# Patient Record
Sex: Female | Born: 1970
Health system: Southern US, Community
[De-identification: ages and names within clinical notes are randomized; demographics above are authoritative.]

## PROBLEM LIST (undated history)

## (undated) DIAGNOSIS — E559 Vitamin D deficiency, unspecified: Secondary | ICD-10-CM

## (undated) DIAGNOSIS — R55 Syncope and collapse: Secondary | ICD-10-CM

## (undated) DIAGNOSIS — R002 Palpitations: Secondary | ICD-10-CM

## (undated) DIAGNOSIS — K219 Gastro-esophageal reflux disease without esophagitis: Secondary | ICD-10-CM

## (undated) DIAGNOSIS — D573 Sickle-cell trait: Secondary | ICD-10-CM

## (undated) DIAGNOSIS — F419 Anxiety disorder, unspecified: Secondary | ICD-10-CM

## (undated) DIAGNOSIS — D649 Anemia, unspecified: Secondary | ICD-10-CM

## (undated) HISTORY — DX: Palpitations: R00.2

## (undated) HISTORY — DX: Gastro-esophageal reflux disease without esophagitis: K21.9

## (undated) HISTORY — DX: Syncope and collapse: R55

## (undated) HISTORY — PX: WISDOM TOOTH EXTRACTION: SHX21

---

## 2007-03-27 ENCOUNTER — Emergency Department (HOSPITAL_COMMUNITY): Admission: EM | Admit: 2007-03-27 | Discharge: 2007-03-27 | Payer: Self-pay | Admitting: Emergency Medicine

## 2007-04-03 ENCOUNTER — Emergency Department (HOSPITAL_COMMUNITY): Admission: EM | Admit: 2007-04-03 | Discharge: 2007-04-03 | Payer: Self-pay | Admitting: Emergency Medicine

## 2007-11-04 ENCOUNTER — Emergency Department (HOSPITAL_COMMUNITY): Admission: EM | Admit: 2007-11-04 | Discharge: 2007-11-04 | Payer: Self-pay | Admitting: Emergency Medicine

## 2008-03-07 ENCOUNTER — Emergency Department (HOSPITAL_COMMUNITY): Admission: EM | Admit: 2008-03-07 | Discharge: 2008-03-07 | Payer: Self-pay | Admitting: Emergency Medicine

## 2008-06-28 ENCOUNTER — Emergency Department (HOSPITAL_COMMUNITY): Admission: EM | Admit: 2008-06-28 | Discharge: 2008-06-28 | Payer: Self-pay | Admitting: Family Medicine

## 2008-11-12 ENCOUNTER — Emergency Department (HOSPITAL_COMMUNITY): Admission: EM | Admit: 2008-11-12 | Discharge: 2008-11-12 | Payer: Self-pay | Admitting: Family Medicine

## 2008-11-25 ENCOUNTER — Emergency Department (HOSPITAL_COMMUNITY): Admission: EM | Admit: 2008-11-25 | Discharge: 2008-11-25 | Payer: Self-pay | Admitting: Emergency Medicine

## 2009-01-27 ENCOUNTER — Emergency Department (HOSPITAL_COMMUNITY): Admission: EM | Admit: 2009-01-27 | Discharge: 2009-01-27 | Payer: Self-pay | Admitting: Emergency Medicine

## 2009-06-11 ENCOUNTER — Emergency Department (HOSPITAL_COMMUNITY): Admission: EM | Admit: 2009-06-11 | Discharge: 2009-06-11 | Payer: Self-pay | Admitting: Emergency Medicine

## 2009-10-14 ENCOUNTER — Emergency Department (HOSPITAL_COMMUNITY): Admission: EM | Admit: 2009-10-14 | Discharge: 2009-10-14 | Payer: Self-pay | Admitting: Emergency Medicine

## 2010-02-13 ENCOUNTER — Encounter: Admission: RE | Admit: 2010-02-13 | Discharge: 2010-02-13 | Payer: Self-pay | Admitting: Family Medicine

## 2010-05-04 ENCOUNTER — Emergency Department (HOSPITAL_COMMUNITY): Admission: EM | Admit: 2010-05-04 | Discharge: 2010-05-04 | Payer: Self-pay | Admitting: Emergency Medicine

## 2010-05-23 DIAGNOSIS — J45909 Unspecified asthma, uncomplicated: Secondary | ICD-10-CM | POA: Insufficient documentation

## 2010-05-23 DIAGNOSIS — J309 Allergic rhinitis, unspecified: Secondary | ICD-10-CM | POA: Insufficient documentation

## 2010-08-18 DIAGNOSIS — E559 Vitamin D deficiency, unspecified: Secondary | ICD-10-CM | POA: Insufficient documentation

## 2010-10-20 DIAGNOSIS — D573 Sickle-cell trait: Secondary | ICD-10-CM | POA: Insufficient documentation

## 2010-11-21 ENCOUNTER — Emergency Department (HOSPITAL_COMMUNITY)
Admission: EM | Admit: 2010-11-21 | Discharge: 2010-11-21 | Disposition: A | Discharge status: Home / Self Care | Payer: Medicaid Other | Attending: Emergency Medicine | Admitting: Emergency Medicine

## 2010-11-21 DIAGNOSIS — R51 Headache: Secondary | ICD-10-CM | POA: Insufficient documentation

## 2010-11-21 DIAGNOSIS — J029 Acute pharyngitis, unspecified: Secondary | ICD-10-CM | POA: Insufficient documentation

## 2010-11-21 DIAGNOSIS — R05 Cough: Secondary | ICD-10-CM | POA: Insufficient documentation

## 2010-11-21 DIAGNOSIS — J45909 Unspecified asthma, uncomplicated: Secondary | ICD-10-CM | POA: Insufficient documentation

## 2010-11-21 DIAGNOSIS — R6889 Other general symptoms and signs: Secondary | ICD-10-CM | POA: Insufficient documentation

## 2010-11-21 DIAGNOSIS — D573 Sickle-cell trait: Secondary | ICD-10-CM | POA: Insufficient documentation

## 2010-11-21 DIAGNOSIS — J329 Chronic sinusitis, unspecified: Secondary | ICD-10-CM | POA: Insufficient documentation

## 2010-11-21 DIAGNOSIS — R059 Cough, unspecified: Secondary | ICD-10-CM | POA: Insufficient documentation

## 2010-11-21 DIAGNOSIS — J3489 Other specified disorders of nose and nasal sinuses: Secondary | ICD-10-CM | POA: Insufficient documentation

## 2010-12-15 LAB — URINALYSIS, ROUTINE W REFLEX MICROSCOPIC
Bilirubin Urine: NEGATIVE
Glucose, UA: NEGATIVE mg/dL
Hgb urine dipstick: NEGATIVE
Ketones, ur: NEGATIVE mg/dL
Protein, ur: NEGATIVE mg/dL
Urobilinogen, UA: 0.2 mg/dL (ref 0.0–1.0)
pH: 6.5 (ref 5.0–8.0)

## 2010-12-15 LAB — URINE CULTURE
Colony Count: NO GROWTH
Culture  Setup Time: 201108041802
Culture: NO GROWTH

## 2010-12-15 LAB — WET PREP, GENITAL: Clue Cells Wet Prep HPF POC: NONE SEEN

## 2010-12-15 LAB — GC/CHLAMYDIA PROBE AMP, GENITAL
Chlamydia, DNA Probe: NEGATIVE
GC Probe Amp, Genital: NEGATIVE

## 2011-01-05 LAB — CBC
HCT: 39.2 % (ref 36.0–46.0)
MCHC: 33.5 g/dL (ref 30.0–36.0)
MCV: 91 fL (ref 78.0–100.0)
Platelets: 278 10*3/uL (ref 150–400)
RBC: 4.3 MIL/uL (ref 3.87–5.11)
RDW: 13.1 % (ref 11.5–15.5)

## 2011-01-05 LAB — POCT I-STAT, CHEM 8
Calcium, Ion: 1.1 mmol/L — ABNORMAL LOW (ref 1.12–1.32)
Glucose, Bld: 90 mg/dL (ref 70–99)
HCT: 41 % (ref 36.0–46.0)
Hemoglobin: 13.9 g/dL (ref 12.0–15.0)
TCO2: 23 mmol/L (ref 0–100)

## 2011-01-05 LAB — DIFFERENTIAL
Basophils Absolute: 0 10*3/uL (ref 0.0–0.1)
Eosinophils Absolute: 0.1 10*3/uL (ref 0.0–0.7)
Eosinophils Relative: 1 % (ref 0–5)
Lymphocytes Relative: 28 % (ref 12–46)
Lymphs Abs: 2.6 10*3/uL (ref 0.7–4.0)
Monocytes Absolute: 0.7 10*3/uL (ref 0.1–1.0)
Neutrophils Relative %: 63 % (ref 43–77)

## 2011-01-05 LAB — URINALYSIS, ROUTINE W REFLEX MICROSCOPIC
Nitrite: NEGATIVE
Protein, ur: NEGATIVE mg/dL
Specific Gravity, Urine: 1.015 (ref 1.005–1.030)
pH: 7 (ref 5.0–8.0)

## 2011-01-05 LAB — WET PREP, GENITAL: Yeast Wet Prep HPF POC: NONE SEEN

## 2011-01-05 LAB — URINE MICROSCOPIC-ADD ON

## 2011-01-05 LAB — PREGNANCY, URINE: Preg Test, Ur: NEGATIVE

## 2011-01-10 LAB — URINALYSIS, ROUTINE W REFLEX MICROSCOPIC
Bilirubin Urine: NEGATIVE
Glucose, UA: NEGATIVE mg/dL
Ketones, ur: NEGATIVE mg/dL
Specific Gravity, Urine: 1.014 (ref 1.005–1.030)

## 2011-01-10 LAB — WET PREP, GENITAL
Trich, Wet Prep: NONE SEEN
Yeast Wet Prep HPF POC: NONE SEEN

## 2011-01-10 LAB — POCT PREGNANCY, URINE: Preg Test, Ur: NEGATIVE

## 2011-01-10 LAB — URINE MICROSCOPIC-ADD ON

## 2011-01-16 LAB — URINE CULTURE

## 2011-01-16 LAB — POCT I-STAT, CHEM 8
Chloride: 108 mEq/L (ref 96–112)
Creatinine, Ser: 0.9 mg/dL (ref 0.4–1.2)
Glucose, Bld: 90 mg/dL (ref 70–99)
HCT: 43 % (ref 36.0–46.0)
Hemoglobin: 14.6 g/dL (ref 12.0–15.0)
Potassium: 4.1 mEq/L (ref 3.5–5.1)
Sodium: 141 mEq/L (ref 135–145)

## 2011-01-16 LAB — URINE MICROSCOPIC-ADD ON

## 2011-01-16 LAB — URINALYSIS, ROUTINE W REFLEX MICROSCOPIC
Bilirubin Urine: NEGATIVE
Ketones, ur: NEGATIVE mg/dL
Nitrite: NEGATIVE
Specific Gravity, Urine: 1.011 (ref 1.005–1.030)
Urobilinogen, UA: 0.2 mg/dL (ref 0.0–1.0)
pH: 8 (ref 5.0–8.0)

## 2011-01-16 LAB — WET PREP, GENITAL
Trich, Wet Prep: NONE SEEN
WBC, Wet Prep HPF POC: NONE SEEN

## 2011-01-25 DIAGNOSIS — R5381 Other malaise: Secondary | ICD-10-CM | POA: Insufficient documentation

## 2011-01-25 DIAGNOSIS — N943 Premenstrual tension syndrome: Secondary | ICD-10-CM | POA: Insufficient documentation

## 2011-04-24 DIAGNOSIS — F411 Generalized anxiety disorder: Secondary | ICD-10-CM | POA: Insufficient documentation

## 2011-05-30 ENCOUNTER — Inpatient Hospital Stay (INDEPENDENT_AMBULATORY_CARE_PROVIDER_SITE_OTHER)
Admission: RE | Admit: 2011-05-30 | Discharge: 2011-05-30 | Disposition: A | Discharge status: Home / Self Care | Payer: Medicaid Other | Source: Ambulatory Visit | Attending: Family Medicine | Admitting: Family Medicine

## 2011-05-30 DIAGNOSIS — M79609 Pain in unspecified limb: Secondary | ICD-10-CM

## 2011-06-18 DIAGNOSIS — M25462 Effusion, left knee: Secondary | ICD-10-CM | POA: Insufficient documentation

## 2011-06-18 DIAGNOSIS — M7650 Patellar tendinitis, unspecified knee: Secondary | ICD-10-CM | POA: Insufficient documentation

## 2011-06-22 LAB — POCT RAPID STREP A: Streptococcus, Group A Screen (Direct): NEGATIVE

## 2011-06-22 LAB — INFLUENZA A AND B ANTIGEN (CONVERTED LAB): Influenza B Ag: NEGATIVE

## 2011-07-02 LAB — POCT RAPID STREP A: Streptococcus, Group A Screen (Direct): NEGATIVE

## 2011-07-17 LAB — URINALYSIS, ROUTINE W REFLEX MICROSCOPIC
Bilirubin Urine: NEGATIVE
Glucose, UA: NEGATIVE
Hgb urine dipstick: NEGATIVE
Ketones, ur: NEGATIVE
Nitrite: NEGATIVE
Protein, ur: NEGATIVE
Specific Gravity, Urine: 1.014
Urobilinogen, UA: 0.2
pH: 7.5

## 2011-07-17 LAB — POCT PREGNANCY, URINE
Operator id: 234111
Preg Test, Ur: NEGATIVE

## 2011-07-17 LAB — WET PREP, GENITAL
Clue Cells Wet Prep HPF POC: NONE SEEN
WBC, Wet Prep HPF POC: NONE SEEN

## 2011-07-17 LAB — RPR: RPR Ser Ql: NONREACTIVE

## 2012-01-11 ENCOUNTER — Ambulatory Visit: Payer: Medicaid Other | Attending: Family Medicine

## 2012-01-11 DIAGNOSIS — R5381 Other malaise: Secondary | ICD-10-CM | POA: Insufficient documentation

## 2012-01-11 DIAGNOSIS — M6281 Muscle weakness (generalized): Secondary | ICD-10-CM | POA: Insufficient documentation

## 2012-01-11 DIAGNOSIS — M25569 Pain in unspecified knee: Secondary | ICD-10-CM | POA: Insufficient documentation

## 2012-01-11 DIAGNOSIS — IMO0001 Reserved for inherently not codable concepts without codable children: Secondary | ICD-10-CM | POA: Insufficient documentation

## 2012-01-16 ENCOUNTER — Ambulatory Visit: Payer: Medicaid Other

## 2012-01-24 ENCOUNTER — Emergency Department (HOSPITAL_COMMUNITY)
Admission: EM | Admit: 2012-01-24 | Discharge: 2012-01-24 | Disposition: A | Discharge status: Home / Self Care | Payer: Medicaid Other | Attending: Emergency Medicine | Admitting: Emergency Medicine

## 2012-01-24 ENCOUNTER — Emergency Department (HOSPITAL_COMMUNITY): Payer: Medicaid Other

## 2012-01-24 ENCOUNTER — Encounter (HOSPITAL_COMMUNITY): Payer: Self-pay | Admitting: Emergency Medicine

## 2012-01-24 DIAGNOSIS — M791 Myalgia, unspecified site: Secondary | ICD-10-CM

## 2012-01-24 DIAGNOSIS — R5383 Other fatigue: Secondary | ICD-10-CM

## 2012-01-24 DIAGNOSIS — IMO0001 Reserved for inherently not codable concepts without codable children: Secondary | ICD-10-CM | POA: Insufficient documentation

## 2012-01-24 DIAGNOSIS — R5381 Other malaise: Secondary | ICD-10-CM | POA: Insufficient documentation

## 2012-01-24 DIAGNOSIS — J45909 Unspecified asthma, uncomplicated: Secondary | ICD-10-CM | POA: Insufficient documentation

## 2012-01-24 LAB — POCT I-STAT, CHEM 8
Creatinine, Ser: 0.7 mg/dL (ref 0.50–1.10)
HCT: 37 % (ref 36.0–46.0)
Hemoglobin: 12.6 g/dL (ref 12.0–15.0)
Sodium: 140 mEq/L (ref 135–145)
TCO2: 25 mmol/L (ref 0–100)

## 2012-01-24 LAB — URINALYSIS, ROUTINE W REFLEX MICROSCOPIC
Bilirubin Urine: NEGATIVE
Nitrite: NEGATIVE
Specific Gravity, Urine: 1.008 (ref 1.005–1.030)
Urobilinogen, UA: 0.2 mg/dL (ref 0.0–1.0)

## 2012-01-24 LAB — URINE MICROSCOPIC-ADD ON

## 2012-01-24 NOTE — ED Notes (Signed)
Patient transported to X-ray 

## 2012-01-24 NOTE — Discharge Instructions (Signed)
Return to the ED with any concerns including fainting, difficulty breathing, vomiting and not able to keep down liquids, or any other alarming symptoms.

## 2012-01-24 NOTE — ED Notes (Signed)
Pt c/o body aches, HA, fatigue and weakness since Monday, no V/D/F, no resp distress, Ibuprofen pta, NAD

## 2012-01-24 NOTE — ED Provider Notes (Signed)
History     CSN: [id number]  Arrival date & time 01/24/12  1245   First MD Initiated Contact with Patient 01/24/12 1322      Chief Complaint  Patient presents with  . Generalized Body Aches    (Consider location/radiation/quality/duration/timing/severity/associated sxs/prior treatment) HPI Patient presents with complaint of diffuse body aches, pleural headache, nausea, generalized fatigue and some lightheadedness upon standing. She states her symptoms have been ongoing for the past 3-4 days. She was in class today and felt lightheaded and very fatigued which prompted her ED evaluation. She has had subjective fever. She denies abdominal pain or chest pain. She's had no vomiting but has had some nausea. She states she's been able to drink fluids normally. She's had no fainting. She has no specific sick contacts. She's had no recent travel. There no other associated systemic symptoms. There are no alleviating or modifying factors. She tried ibuprofen this morning which did not provide any her symptoms.  Past Medical History  Diagnosis Date  . Asthma     History reviewed. No pertinent past surgical history.  No family history on file.  History  Substance Use Topics  . Smoking status: Never Smoker   . Smokeless tobacco: Not on file  . Alcohol Use: No    OB History    Grav Para Term Preterm Abortions TAB SAB Ect Mult Living                  Review of Systems ROS reviewed and all otherwise negative except for mentioned in HPI  Allergies  Review of patient's allergies indicates no known allergies.  Home Medications   Current Outpatient Rx  Name Route Sig Dispense Refill  . CALCIUM CARBONATE-VITAMIN D 500-200 MG-UNIT PO TABS Oral Take 1 tablet by mouth daily.    . IBUPROFEN 200 MG PO TABS Oral Take 200 mg by mouth every 6 (six) hours as needed. As needed for pain.    . MELOXICAM 7.5 MG PO TABS Oral Take 7.5 mg by mouth 2 (two) times daily as needed. As needed for knee pain.     . ADULT MULTIVITAMIN W/MINERALS CH Oral Take 1 tablet by mouth daily.    . OXYCODONE-ACETAMINOPHEN 5-325 MG PO TABS Oral Take 1 tablet by mouth once as needed. As needed for pain.      BP 123/75  Pulse 74  Temp(Src) 98.1 F (36.7 C) (Oral)  Resp 18  Wt 190 lb (86.183 kg)  SpO2 98%  LMP 12/27/2011 Vitals reviewed Physical Exam Physical Examination: General appearance - alert, well appearing, and in no distress Mental status - alert, oriented to person, place, and time Eyes - pupils equal and reactive, no scleral icterus or conjunctival injection Mouth - mucous membranes moist, pharynx normal without lesions Neck- no sig LAD, from Chest - clear to auscultation, no wheezes, rales or rhonchi, symmetric air entry Heart - normal rate, regular rhythm, normal S1, S2, no murmurs, rubs, clicks or gallops Abdomen - soft, nontender, nondistended, no masses or organomegaly, nabs Extremities - peripheral pulses normal, no pedal edema, no clubbing or cyanosis Skin - normal coloration and turgor, no rashes Psych- flat affect  ED Course  Procedures (including critical care time)  Labs Reviewed  URINALYSIS, ROUTINE W REFLEX MICROSCOPIC - Abnormal; Notable for the following:    APPearance CLOUDY (*)    Leukocytes, UA SMALL (*)    All other components within normal limits  URINE MICROSCOPIC-ADD ON - Abnormal; Notable for the following:  Squamous Epithelial / LPF MANY (*)    Bacteria, UA FEW (*)    All other components within normal limits  PREGNANCY, URINE  POCT I-STAT, CHEM 8  URINE CULTURE   Dg Chest 2 View  01/24/2012  *RADIOLOGY REPORT*  Clinical Data: Generalized bodyaches.  SOB and cough.  CHEST - 2 VIEW  Comparison: None  Findings: The heart size and mediastinal contours are within normal limits.  Both lungs are clear.  The visualized skeletal structures are unremarkable.  IMPRESSION: Negative exam.  Original Report Authenticated By: Rosealee Albee, M.D.     1. Myalgia   2.  Fatigue       MDM  Patient presents with generalized fatigue body aches subjective fever. Her workup in the emergency department shows that she is not anemic. She does not appear dehydrated and her orthostatic vital signs are normal. Her urinalysis appears to be a contaminated specimen so it was sent for urine culture. A chest x-ray also did not reveal any acute abnormalities. I suspect her symptoms may be due to a viral infection. There is no evidence of an acute emergent medical condition present today. I've advised her to continue Tylenol and/or ibuprofen for body aches and increase her fluid intake. She is given strict return precautions and she is agreeable with this plan.        Ethelda Chick, MD 01/24/12 [phone]

## 2012-01-24 NOTE — ED Notes (Signed)
Patient states that she has had dizziness since Monday.  Pt states that she had n/v/d on Tuesday and stayed home yesterday from school because of same.  Pt denies any n/v.  Pt states that she feels weak at this time. Pt states that she has had a decrease in her appetite with same.

## 2012-01-24 NOTE — ED Notes (Signed)
MD at pt bedside.  See MD assessment.

## 2012-01-25 ENCOUNTER — Ambulatory Visit: Payer: Medicaid Other

## 2012-01-30 ENCOUNTER — Ambulatory Visit: Payer: Medicaid Other | Attending: Family Medicine

## 2012-01-30 DIAGNOSIS — IMO0001 Reserved for inherently not codable concepts without codable children: Secondary | ICD-10-CM | POA: Insufficient documentation

## 2012-01-30 DIAGNOSIS — M6281 Muscle weakness (generalized): Secondary | ICD-10-CM | POA: Insufficient documentation

## 2012-01-30 DIAGNOSIS — R5381 Other malaise: Secondary | ICD-10-CM | POA: Insufficient documentation

## 2012-01-30 DIAGNOSIS — M25569 Pain in unspecified knee: Secondary | ICD-10-CM | POA: Insufficient documentation

## 2012-05-13 ENCOUNTER — Encounter (HOSPITAL_COMMUNITY): Payer: Self-pay | Admitting: *Deleted

## 2012-05-13 ENCOUNTER — Emergency Department (INDEPENDENT_AMBULATORY_CARE_PROVIDER_SITE_OTHER)
Admission: EM | Admit: 2012-05-13 | Discharge: 2012-05-13 | Disposition: A | Discharge status: Home / Self Care | Payer: Medicaid Other | Source: Home / Self Care | Attending: Emergency Medicine | Admitting: Emergency Medicine

## 2012-05-13 DIAGNOSIS — N760 Acute vaginitis: Secondary | ICD-10-CM

## 2012-05-13 LAB — POCT URINALYSIS DIP (DEVICE)
Bilirubin Urine: NEGATIVE
Glucose, UA: NEGATIVE mg/dL
Nitrite: NEGATIVE

## 2012-05-13 LAB — WET PREP, GENITAL
Clue Cells Wet Prep HPF POC: NONE SEEN
Trich, Wet Prep: NONE SEEN

## 2012-05-13 LAB — POCT PREGNANCY, URINE: Preg Test, Ur: NEGATIVE

## 2012-05-13 MED ORDER — FLUCONAZOLE 150 MG PO TABS: 150.0000 mg | ORAL_TABLET | Freq: Once | ORAL | Status: AC

## 2012-05-13 MED ORDER — METRONIDAZOLE 500 MG PO TABS: 500.0000 mg | ORAL_TABLET | Freq: Two times a day (BID) | ORAL | Status: AC

## 2012-05-13 NOTE — ED Notes (Signed)
Pt reports vaginal irritation and itching for the past week with no relief from otc remedies

## 2012-05-13 NOTE — ED Provider Notes (Signed)
Chief Complaint  Patient presents with  . Vaginal Itching    History of Present Illness:   The patient is a 41 year old female with a one-week history of vulvar and vaginal itching and odor. She has not had much of a discharge. She notes irritation and swelling externally. She's also had some pelvic or lower back pain and some frequent urination. She denies any fever, chills, nausea, vomiting and her menses have been regular. Last menstrual period was July 15. She is sexually active. Her husband has a vasectomy. She's had a history of chlamydia and urinary tract infection in the past.  Review of Systems:  Other than noted above, the patient denies any of the following symptoms: Systemic:  No fever, chills, sweats, fatigue, or weight loss. GI:  No abdominal pain, nausea, anorexia, vomiting, diarrhea, constipation, melena or hematochezia. GU:  No dysuria, frequency, urgency, hematuria, vaginal discharge, itching, or abnormal vaginal bleeding. Skin:  No rash or itching.   PMFSH:  Past medical history, family history, social history, meds, and allergies were reviewed.  Physical Exam:   Vital signs:  BP 128/79  Pulse 78  Temp 99 F (37.2 C) (Oral)  Resp 16  SpO2 99%  LMP 04/09/2012 General:  Alert, oriented and in no distress. Lungs:  Breath sounds clear and equal bilaterally.  No wheezes, rales or rhonchi. Heart:  Regular rhythm.  No gallops or murmers. Abdomen:  Soft, flat and non-distended.  No organomegaly or mass.  No tenderness, guarding or rebound.  Bowel sounds normally active. Pelvic exam:  There is some swelling and discharge in external genitalia. There is a scant amount of whitish, frothy discharge. She has slight cervical motion tenderness. There is no uterine tenderness. No adnexal tenderness or mass. Skin:  Clear, warm and dry.  Labs:   Results for orders placed during the hospital encounter of 05/13/12  POCT URINALYSIS DIP (DEVICE)      Component Value Range   Glucose,  UA NEGATIVE  NEGATIVE mg/dL   Bilirubin Urine NEGATIVE  NEGATIVE   Ketones, ur NEGATIVE  NEGATIVE mg/dL   Specific Gravity, Urine 1.015  1.005 - 1.030   Hgb urine dipstick SMALL (*) NEGATIVE   pH 5.5  5.0 - 8.0   Protein, ur NEGATIVE  NEGATIVE mg/dL   Urobilinogen, UA 0.2  0.0 - 1.0 mg/dL   Nitrite NEGATIVE  NEGATIVE   Leukocytes, UA LARGE (*) NEGATIVE  POCT PREGNANCY, URINE      Component Value Range   Preg Test, Ur NEGATIVE  NEGATIVE     Assessment:  The encounter diagnosis was Vaginitis. This appears to be yeast or Trichomonas are both. Pending results of wet prep, will give her prescriptions for both and she will call back tomorrow about results.  Plan:   1.  The following meds were prescribed:   New Prescriptions   FLUCONAZOLE (DIFLUCAN) 150 MG TABLET    Take 1 tablet (150 mg total) by mouth once.   METRONIDAZOLE (FLAGYL) 500 MG TABLET    Take 1 tablet (500 mg total) by mouth 2 (two) times daily.   2.  The patient was instructed in symptomatic care and handouts were given. 3.  The patient was told to return if becoming worse in any way, if no better in 3 or 4 days, and given some red flag symptoms that would indicate earlier return.    Reuben Likes, MD 05/13/12 [phone]

## 2012-05-14 LAB — URINE CULTURE: Special Requests: NORMAL

## 2012-05-14 NOTE — ED Notes (Signed)
GC/Chlamydia neg., Wet prep: mod. Yeast, WBC's TNTC, Urine culture: >100,000 colonies multiple bacterial types none predominant.  Pt. adequately treated with Diflucan. Vassie Moselle 05/14/2012

## 2012-07-17 ENCOUNTER — Encounter (HOSPITAL_COMMUNITY): Payer: Self-pay | Admitting: *Deleted

## 2012-07-17 ENCOUNTER — Emergency Department (HOSPITAL_COMMUNITY): Payer: Medicaid Other

## 2012-07-17 ENCOUNTER — Emergency Department (HOSPITAL_COMMUNITY)
Admission: EM | Admit: 2012-07-17 | Discharge: 2012-07-17 | Disposition: A | Discharge status: Home Health | Payer: Medicaid Other | Attending: Emergency Medicine | Admitting: Emergency Medicine

## 2012-07-17 DIAGNOSIS — IMO0001 Reserved for inherently not codable concepts without codable children: Secondary | ICD-10-CM | POA: Insufficient documentation

## 2012-07-17 DIAGNOSIS — J3489 Other specified disorders of nose and nasal sinuses: Secondary | ICD-10-CM | POA: Insufficient documentation

## 2012-07-17 DIAGNOSIS — Z79899 Other long term (current) drug therapy: Secondary | ICD-10-CM | POA: Insufficient documentation

## 2012-07-17 DIAGNOSIS — R059 Cough, unspecified: Secondary | ICD-10-CM | POA: Insufficient documentation

## 2012-07-17 DIAGNOSIS — J069 Acute upper respiratory infection, unspecified: Secondary | ICD-10-CM | POA: Insufficient documentation

## 2012-07-17 DIAGNOSIS — R070 Pain in throat: Secondary | ICD-10-CM | POA: Insufficient documentation

## 2012-07-17 DIAGNOSIS — R05 Cough: Secondary | ICD-10-CM | POA: Insufficient documentation

## 2012-07-17 MED ORDER — TRAMADOL HCL 50 MG PO TABS: 50.0000 mg | ORAL_TABLET | Freq: Four times a day (QID) | ORAL | Status: DC | PRN

## 2012-07-17 MED ORDER — ALBUTEROL SULFATE HFA 108 (90 BASE) MCG/ACT IN AERS
2.0000 | INHALATION_SPRAY | RESPIRATORY_TRACT | Status: DC | PRN
  Administered 2012-07-17: 2 via RESPIRATORY_TRACT
  Filled 2012-07-17: qty 6.7

## 2012-07-17 MED ORDER — ALBUTEROL SULFATE HFA 108 (90 BASE) MCG/ACT IN AERS: 1.0000 | INHALATION_SPRAY | Freq: Four times a day (QID) | RESPIRATORY_TRACT | Status: DC | PRN

## 2012-07-17 NOTE — ED Provider Notes (Signed)
History     CSN: [id number]  Arrival date & time 07/17/12  4098   First MD Initiated Contact with Patient 07/17/12 0840      Chief Complaint  Patient presents with  . URI    (Consider location/radiation/quality/duration/timing/severity/associated sxs/prior treatment) HPI Pt to the ER with complaints or nasal congestion, sore throat, coughing up phlegm and myalgias for 1 week. She says she has had subjective fevers. No vomiting or diarrhea. No weakness, just feels tired. She has tried her inhaler at home but it is really old and she does not feel that it is working. She denies any other symptoms, vss nad.  Past Medical History  Diagnosis Date  . Asthma     History reviewed. No pertinent past surgical history.  Family History  Problem Relation Age of Onset  . Diabetes Mother     History  Substance Use Topics  . Smoking status: Never Smoker   . Smokeless tobacco: Not on file  . Alcohol Use: No    OB History    Grav Para Term Preterm Abortions TAB SAB Ect Mult Living                  Review of Systems  Review of Systems  Gen: no weight loss, fevers, chills, night sweats  Eyes: no discharge or drainage, no occular pain or visual changes  Nose: no epistaxis + rhinorrhea  Mouth: no dental pain, + sore throat  Neck: no neck pain  Lungs:+ mild wheezing and coughing no hemoptysis CV: no chest pain, palpitations, dependent edema or orthopnea  Abd: no abdominal pain, nausea, vomiting  GU: no dysuria or gross hematuria  MSK:  No abnormalities  Neuro: no headache, no focal neurologic deficits  Skin: no abnormalities Psyche: negative.   Allergies  Review of patient's allergies indicates no known allergies.  Home Medications   Current Outpatient Rx  Name Route Sig Dispense Refill  . ACETAMINOPHEN 500 MG PO TABS Oral Take 500 mg by mouth every 6 (six) hours as needed. For body ache    . ALBUTEROL SULFATE HFA 108 (90 BASE) MCG/ACT IN AERS Inhalation Inhale 2 puffs  into the lungs every 4 (four) hours as needed. For shortness of breath    . VITAMIN C PO Oral Take 1 tablet by mouth daily.    Marland Kitchen DIPHENHYDRAMINE HCL 25 MG PO TABS Oral Take 25 mg by mouth every 6 (six) hours as needed. For allergies    . FLUTICASONE PROPIONATE 50 MCG/ACT NA SUSP Nasal Place 2 sprays into the nose daily.    . GUAIFENESIN 100 MG/5ML PO SYRP Oral Take 400 mg by mouth every 4 (four) hours as needed. For cough and congestion    . ADULT MULTIVITAMIN W/MINERALS CH Oral Take 1 tablet by mouth daily.    . ALBUTEROL SULFATE HFA 108 (90 BASE) MCG/ACT IN AERS Inhalation Inhale 1-2 puffs into the lungs every 6 (six) hours as needed for wheezing. 1 Inhaler 2  . TRAMADOL HCL 50 MG PO TABS Oral Take 1 tablet (50 mg total) by mouth every 6 (six) hours as needed for pain. 15 tablet 0    BP 133/68  Pulse 69  Temp 97.9 F (36.6 C) (Oral)  Resp 18  SpO2 100%  LMP 06/20/2012  Physical Exam  Nursing note and vitals reviewed. Constitutional: She appears well-developed and well-nourished. No distress.  HENT:  Head: Normocephalic and atraumatic.  Nose: Rhinorrhea present.  Mouth/Throat: Oropharynx is clear and moist.  Eyes: Pupils  are equal, round, and reactive to light.  Neck: Normal range of motion. Neck supple.  Cardiovascular: Normal rate and regular rhythm.   Pulmonary/Chest: Effort normal. No respiratory distress. She has wheezes (very faint wheezes). She has no rales. She exhibits no tenderness.  Abdominal: Soft.  Neurological: She is alert.  Skin: Skin is warm and dry.    ED Course  Procedures (including critical care time)  Labs Reviewed - No data to display Dg Chest 2 View  07/17/2012  *RADIOLOGY REPORT*  Clinical Data: Upper respiratory tract infections  CHEST - 2 VIEW  Comparison: 01/24/2012  Findings: Heart size is normal.  No pleural effusion or edema. There is no airspace consolidation identified.  Review of the visualized osseous structures is unremarkable.  IMPRESSION:   1.  No acute cardiopulmonary abnormalities   Original Report Authenticated By: Rosealee Albee, M.D.      1. URI (upper respiratory infection)       MDM  Symptoms most likely viral. No fevers or tachycardia. Normal chest xray. Albuterol pump given in ED as well as an Rx for albuterol and Tramadol. Pt given work note for 2 days so she can rest.  Pt has been advised of the symptoms that warrant their return to the ED. Patient has voiced understanding and has agreed to follow-up with the PCP or specialist.         Dorthula Matas, PA 07/17/12 [phone]

## 2012-07-17 NOTE — ED Provider Notes (Signed)
Medical screening examination/treatment/procedure(s) were performed by non-physician practitioner and as supervising physician I was immediately available for consultation/collaboration.   Joya Gaskins, MD 07/17/12 1622

## 2012-07-17 NOTE — ED Notes (Signed)
Patient with uri congestion x 1 day

## 2012-10-20 ENCOUNTER — Encounter (HOSPITAL_COMMUNITY): Payer: Self-pay | Admitting: *Deleted

## 2012-10-20 DIAGNOSIS — Z3202 Encounter for pregnancy test, result negative: Secondary | ICD-10-CM | POA: Insufficient documentation

## 2012-10-20 DIAGNOSIS — J45909 Unspecified asthma, uncomplicated: Secondary | ICD-10-CM | POA: Insufficient documentation

## 2012-10-20 DIAGNOSIS — Z79899 Other long term (current) drug therapy: Secondary | ICD-10-CM | POA: Insufficient documentation

## 2012-10-20 DIAGNOSIS — R300 Dysuria: Secondary | ICD-10-CM | POA: Insufficient documentation

## 2012-10-20 LAB — CBC WITH DIFFERENTIAL/PLATELET
Basophils Relative: 0 % (ref 0–1)
Eosinophils Absolute: 0.1 10*3/uL (ref 0.0–0.7)
Eosinophils Relative: 1 % (ref 0–5)
MCH: 27.9 pg (ref 26.0–34.0)
MCHC: 33.7 g/dL (ref 30.0–36.0)
MCV: 82.7 fL (ref 78.0–100.0)
Monocytes Relative: 8 % (ref 3–12)
Neutrophils Relative %: 56 % (ref 43–77)
Platelets: 324 10*3/uL (ref 150–400)

## 2012-10-20 LAB — COMPREHENSIVE METABOLIC PANEL
Albumin: 3.5 g/dL (ref 3.5–5.2)
Alkaline Phosphatase: 109 U/L (ref 39–117)
BUN: 12 mg/dL (ref 6–23)
Calcium: 9.2 mg/dL (ref 8.4–10.5)
GFR calc Af Amer: >90 mL/min (ref >90)
Potassium: 4.3 mEq/L (ref 3.5–5.1)
Total Protein: 7.4 g/dL (ref 6.0–8.3)

## 2012-10-20 LAB — LIPASE, BLOOD: Lipase: 22 U/L (ref 11–59)

## 2012-10-20 LAB — URINALYSIS, ROUTINE W REFLEX MICROSCOPIC
Ketones, ur: NEGATIVE mg/dL
Leukocytes, UA: NEGATIVE
Nitrite: NEGATIVE
Protein, ur: NEGATIVE mg/dL

## 2012-10-20 NOTE — ED Notes (Signed)
The pt has had urinary frequency for 7 days or longer.  She was out of town and just came back.  She is c/o also of abd pain and nv and diarrhea.  lmp  Last sunday

## 2012-10-21 ENCOUNTER — Emergency Department (HOSPITAL_COMMUNITY)
Admission: EM | Admit: 2012-10-21 | Discharge: 2012-10-21 | Disposition: A | Discharge status: Home / Self Care | Payer: Medicaid Other | Attending: Emergency Medicine | Admitting: Emergency Medicine

## 2012-10-21 DIAGNOSIS — R300 Dysuria: Secondary | ICD-10-CM

## 2012-10-21 MED ORDER — ONDANSETRON HCL 4 MG PO TABS: 4.0000 mg | ORAL_TABLET | Freq: Four times a day (QID) | ORAL | Status: DC

## 2012-10-21 MED ORDER — CIPROFLOXACIN HCL 500 MG PO TABS: 500.0000 mg | ORAL_TABLET | Freq: Two times a day (BID) | ORAL | Status: DC

## 2012-10-21 MED ORDER — ONDANSETRON 4 MG PO TBDP
4.0000 mg | ORAL_TABLET | Freq: Once | ORAL | Status: AC
  Administered 2012-10-21: 4 mg via ORAL
  Filled 2012-10-21: qty 1

## 2012-10-21 MED ORDER — PHENAZOPYRIDINE HCL 200 MG PO TABS: 200.0000 mg | ORAL_TABLET | Freq: Three times a day (TID) | ORAL | Status: DC

## 2012-10-21 NOTE — ED Notes (Signed)
Patient states that she was on a bus trip and did not got to the [place] as she usually does.  States she was having painful urination and hard stools.  Tonight her lower back was beginning to hurt.

## 2012-10-21 NOTE — ED Provider Notes (Signed)
History     CSN: [id number]  Arrival date & time 10/20/12  2133   First MD Initiated Contact with Patient 10/21/12 0051      Chief Complaint  Patient presents with  . Abdominal Pain    (Consider location/radiation/quality/duration/timing/severity/associated sxs/prior treatment) Patient is a 42 y.o. female presenting with abdominal pain. The history is provided by the patient.  Abdominal Pain The primary symptoms of the illness include abdominal pain and dysuria. The current episode started more than 2 days ago. The onset of the illness was gradual. The problem has not changed since onset. The abdominal pain began more than 2 days ago. The abdominal pain has been gradually worsening since its onset. The abdominal pain is located in the suprapubic region. The abdominal pain radiates to the back.    Past Medical History  Diagnosis Date  . Asthma     History reviewed. No pertinent past surgical history.  Family History  Problem Relation Age of Onset  . Diabetes Mother     History  Substance Use Topics  . Smoking status: Never Smoker   . Smokeless tobacco: Not on file  . Alcohol Use: No    OB History    Grav Para Term Preterm Abortions TAB SAB Ect Mult Living                  Review of Systems  Gastrointestinal: Positive for abdominal pain.  Genitourinary: Positive for dysuria.  All other systems reviewed and are negative.    Allergies  Review of patient's allergies indicates no known allergies.  Home Medications   Current Outpatient Rx  Name  Route  Sig  Dispense  Refill  . ACETAMINOPHEN 500 MG PO TABS   Oral   Take 500 mg by mouth every 6 (six) hours as needed. For body ache         . ALBUTEROL SULFATE HFA 108 (90 BASE) MCG/ACT IN AERS   Inhalation   Inhale 2 puffs into the lungs every 4 (four) hours as needed. For shortness of breath         . ALBUTEROL SULFATE HFA 108 (90 BASE) MCG/ACT IN AERS   Inhalation   Inhale 1-2 puffs into the lungs  every 6 (six) hours as needed for wheezing.   1 Inhaler   2   . VITAMIN C PO   Oral   Take 1 tablet by mouth daily.         Marland Kitchen DIPHENHYDRAMINE HCL 25 MG PO TABS   Oral   Take 25 mg by mouth every 6 (six) hours as needed. For allergies         . FLUTICASONE PROPIONATE 50 MCG/ACT NA SUSP   Nasal   Place 2 sprays into the nose daily.         . GUAIFENESIN 100 MG/5ML PO SYRP   Oral   Take 400 mg by mouth every 4 (four) hours as needed. For cough and congestion         . ADULT MULTIVITAMIN W/MINERALS CH   Oral   Take 1 tablet by mouth daily.         . TRAMADOL HCL 50 MG PO TABS   Oral   Take 1 tablet (50 mg total) by mouth every 6 (six) hours as needed for pain.   15 tablet   0     BP 131/54  Pulse 62  Temp 97.6 F (36.4 C) (Oral)  Resp 16  SpO2 99%  LMP  10/14/2012  Physical Exam  Constitutional: She is oriented to person, place, and time. She appears well-developed and well-nourished.  HENT:  Head: Normocephalic and atraumatic.  Eyes: Conjunctivae normal and EOM are normal. Pupils are equal, round, and reactive to light.  Neck: Normal range of motion.  Cardiovascular: Normal rate, regular rhythm and normal heart sounds.   Pulmonary/Chest: Effort normal and breath sounds normal.  Abdominal: Soft. Bowel sounds are normal.  Musculoskeletal: Normal range of motion.  Neurological: She is alert and oriented to person, place, and time.  Skin: Skin is warm and dry.  Psychiatric: She has a normal mood and affect. Her behavior is normal.    ED Course  Procedures (including critical care time)  Labs Reviewed  URINALYSIS, ROUTINE W REFLEX MICROSCOPIC - Abnormal; Notable for the following:    APPearance HAZY (*)     All other components within normal limits  CBC WITH DIFFERENTIAL - Abnormal; Notable for the following:    Hemoglobin 11.6 (*)     HCT 34.4 (*)     All other components within normal limits  COMPREHENSIVE METABOLIC PANEL - Abnormal; Notable for  the following:    Total Bilirubin 0.2 (*)     All other components within normal limits  PREGNANCY, URINE  LIPASE, BLOOD   No results found.   No diagnosis found.    MDM  + dysuria,  Suprapubic pain.  No fever, no white count.  Minimal tenderness. Some nausea.  Not pregnant.  WIll trial abx,  Anitemetic,  Dc to fu, ret new/worsening sxs        Demetric Parslow Lytle Michaels, MD 10/21/12 [phone]

## 2013-08-17 ENCOUNTER — Encounter (HOSPITAL_COMMUNITY): Payer: Self-pay | Admitting: Emergency Medicine

## 2013-08-17 ENCOUNTER — Emergency Department (INDEPENDENT_AMBULATORY_CARE_PROVIDER_SITE_OTHER): Payer: Medicaid Other

## 2013-08-17 ENCOUNTER — Emergency Department (HOSPITAL_COMMUNITY)
Admission: EM | Admit: 2013-08-17 | Discharge: 2013-08-17 | Disposition: A | Discharge status: Home / Self Care | Payer: Medicaid Other | Source: Home / Self Care | Attending: Emergency Medicine | Admitting: Emergency Medicine

## 2013-08-17 DIAGNOSIS — J329 Chronic sinusitis, unspecified: Secondary | ICD-10-CM

## 2013-08-17 MED ORDER — AMOXICILLIN 875 MG PO TABS: 875.0000 mg | ORAL_TABLET | Freq: Two times a day (BID) | ORAL | Status: DC

## 2013-08-17 NOTE — ED Provider Notes (Signed)
CSN: [id number]     Arrival date & time 08/17/13  1006 History   First MD Initiated Contact with Patient 08/17/13 1252     Chief Complaint  Patient presents with  . URI   (Consider location/radiation/quality/duration/timing/severity/associated sxs/prior Treatment) HPI Comments: Pt reports cold sx for 8 days, gradually worsening over last few days; now achy everywhere, coughing up green sputum.   Patient is a 42 y.o. female presenting with URI. The history is provided by the patient.  URI Presenting symptoms: congestion, cough and facial pain   Presenting symptoms: no fever, no rhinorrhea and no sore throat   Severity:  Moderate Onset quality:  Gradual Duration:  8 days Timing:  Constant Progression:  Worsening Chronicity:  New Relieved by:  Nothing Worsened by:  Nothing tried Ineffective treatments:  OTC medications Associated symptoms: myalgias, sinus pain and wheezing   Associated symptoms: no swollen glands     Past Medical History  Diagnosis Date  . Asthma    History reviewed. No pertinent past surgical history. Family History  Problem Relation Age of Onset  . Diabetes Mother    History  Substance Use Topics  . Smoking status: Never Smoker   . Smokeless tobacco: Not on file  . Alcohol Use: No   OB History   Grav Para Term Preterm Abortions TAB SAB Ect Mult Living                 Review of Systems  Constitutional: Positive for chills. Negative for fever.  HENT: Positive for congestion and sinus pressure. Negative for rhinorrhea and sore throat.   Respiratory: Positive for cough and wheezing.   Musculoskeletal: Positive for myalgias.    Allergies  Review of patient's allergies indicates no known allergies.  Home Medications   Current Outpatient Rx  Name  Route  Sig  Dispense  Refill  . cetirizine (ZYRTEC) 5 MG tablet   Oral   Take 5 mg by mouth daily.         Marland Kitchen VITAMIN D, CHOLECALCIFEROL, PO   Oral   Take 1 tablet by mouth daily.         Marland Kitchen  amoxicillin (AMOXIL) 875 MG tablet   Oral   Take 1 tablet (875 mg total) by mouth 2 (two) times daily.   20 tablet   0   . ciprofloxacin (CIPRO) 500 MG tablet   Oral   Take 1 tablet (500 mg total) by mouth 2 (two) times daily.   20 tablet   0   . ondansetron (ZOFRAN) 4 MG tablet   Oral   Take 1 tablet (4 mg total) by mouth every 6 (six) hours.   12 tablet   0   . phenazopyridine (PYRIDIUM) 200 MG tablet   Oral   Take 1 tablet (200 mg total) by mouth 3 (three) times daily.   6 tablet   0    BP 129/68  Pulse 68  Temp(Src) 98.2 F (36.8 C)  Resp 16  SpO2 98%  LMP 08/06/2013 Physical Exam  Constitutional: She appears well-developed and well-nourished. She appears ill.  HENT:  Right Ear: Tympanic membrane, external ear and ear canal normal.  Left Ear: Tympanic membrane, external ear and ear canal normal.  Nose: No mucosal edema or rhinorrhea. Right sinus exhibits maxillary sinus tenderness. Right sinus exhibits no frontal sinus tenderness. Left sinus exhibits maxillary sinus tenderness. Left sinus exhibits no frontal sinus tenderness.  Mouth/Throat: Oropharynx is clear and moist and mucous membranes are normal.  Cardiovascular: Normal rate and regular rhythm.   Pulmonary/Chest: Effort normal and breath sounds normal. She has no wheezes.  Occasional coughing during exam  Lymphadenopathy:       Head (right side): No submental, no submandibular and no tonsillar adenopathy present.       Head (left side): No submental, no submandibular and no tonsillar adenopathy present.    She has no cervical adenopathy.    ED Course  Procedures (including critical care time) Labs Review Labs Reviewed - No data to display Imaging Review Dg Chest 2 View  08/17/2013   CLINICAL DATA:  Cough, congestion, chest pain  EXAM: CHEST  2 VIEW  COMPARISON:  07/17/2012  FINDINGS: The heart size and mediastinal contours are within normal limits. Both lungs are clear. The visualized skeletal  structures are unremarkable.  IMPRESSION: No active cardiopulmonary disease.   Electronically Signed   By: Salome Holmes M.D.   On: 08/17/2013 12:31    EKG Interpretation    Date/Time:    Ventricular Rate:    PR Interval:    QRS Duration:   QT Interval:    QTC Calculation:   R Axis:     Text Interpretation:              MDM   1. Sinusitis   rx amoxicillin 875mg  BID #20.     Cathlyn Parsons, NP 08/17/13 1257

## 2013-08-17 NOTE — ED Notes (Addendum)
C/o chest pain denies cardiac hx. Onset today. States cough,cong. And wheezing this past weekend. Pt assessed. Vitals wnl. No signs of distress.  Informed to notify front desk if anything changes. Pt placed back in waiting area.

## 2013-08-17 NOTE — ED Notes (Addendum)
C/o chest congestion and pain, denies cardiac hx. Sob, wheezing.  States having to use inhaler more frequently.  And generalized body aches.  Pt has tried thera flu, vitamin c, and inhaler with no relief.  Onset of symptoms last Thursday.  Gradually getting worse.

## 2013-08-17 NOTE — ED Provider Notes (Signed)
Medical screening examination/treatment/procedure(s) were performed by non-physician practitioner and as supervising physician I was immediately available for consultation/collaboration.  Leslee Home, M.D.  Reuben Likes, MD 08/17/13 [phone]

## 2013-09-17 DIAGNOSIS — F4329 Adjustment disorder with other symptoms: Secondary | ICD-10-CM | POA: Insufficient documentation

## 2013-10-09 DIAGNOSIS — J069 Acute upper respiratory infection, unspecified: Secondary | ICD-10-CM | POA: Insufficient documentation

## 2014-01-31 DIAGNOSIS — R42 Dizziness and giddiness: Secondary | ICD-10-CM | POA: Insufficient documentation

## 2014-05-27 ENCOUNTER — Encounter (HOSPITAL_COMMUNITY): Payer: Self-pay | Admitting: Emergency Medicine

## 2014-05-27 ENCOUNTER — Emergency Department (HOSPITAL_COMMUNITY): Payer: Medicaid Other

## 2014-05-27 ENCOUNTER — Emergency Department (HOSPITAL_COMMUNITY)
Admission: EM | Admit: 2014-05-27 | Discharge: 2014-05-28 | Disposition: A | Discharge status: Home / Self Care | Payer: Medicaid Other | Attending: Emergency Medicine | Admitting: Emergency Medicine

## 2014-05-27 DIAGNOSIS — J3489 Other specified disorders of nose and nasal sinuses: Secondary | ICD-10-CM | POA: Diagnosis present

## 2014-05-27 DIAGNOSIS — J019 Acute sinusitis, unspecified: Secondary | ICD-10-CM | POA: Diagnosis not present

## 2014-05-27 DIAGNOSIS — R0789 Other chest pain: Secondary | ICD-10-CM | POA: Insufficient documentation

## 2014-05-27 DIAGNOSIS — J45901 Unspecified asthma with (acute) exacerbation: Secondary | ICD-10-CM | POA: Diagnosis not present

## 2014-05-27 DIAGNOSIS — R0601 Orthopnea: Secondary | ICD-10-CM | POA: Insufficient documentation

## 2014-05-27 DIAGNOSIS — R5381 Other malaise: Secondary | ICD-10-CM | POA: Insufficient documentation

## 2014-05-27 DIAGNOSIS — Z79899 Other long term (current) drug therapy: Secondary | ICD-10-CM | POA: Diagnosis not present

## 2014-05-27 DIAGNOSIS — IMO0002 Reserved for concepts with insufficient information to code with codable children: Secondary | ICD-10-CM | POA: Diagnosis not present

## 2014-05-27 DIAGNOSIS — Z3202 Encounter for pregnancy test, result negative: Secondary | ICD-10-CM | POA: Diagnosis not present

## 2014-05-27 DIAGNOSIS — R5383 Other fatigue: Secondary | ICD-10-CM

## 2014-05-27 DIAGNOSIS — R51 Headache: Secondary | ICD-10-CM | POA: Insufficient documentation

## 2014-05-27 LAB — COMPREHENSIVE METABOLIC PANEL
ALK PHOS: 122 U/L — AB (ref 39–117)
ALT: 11 U/L (ref 0–35)
ANION GAP: 13 (ref 5–15)
AST: 19 U/L (ref 0–37)
Albumin: 3.8 g/dL (ref 3.5–5.2)
BILIRUBIN TOTAL: 0.2 mg/dL — AB (ref 0.3–1.2)
BUN: 12 mg/dL (ref 6–23)
CHLORIDE: 101 meq/L (ref 96–112)
CO2: 24 mEq/L (ref 19–32)
Calcium: 8.9 mg/dL (ref 8.4–10.5)
Creatinine, Ser: 0.64 mg/dL (ref 0.50–1.10)
GLUCOSE: 83 mg/dL (ref 70–99)
Potassium: 4.3 mEq/L (ref 3.7–5.3)
SODIUM: 138 meq/L (ref 137–147)
TOTAL PROTEIN: 7.4 g/dL (ref 6.0–8.3)

## 2014-05-27 LAB — CBC
HEMATOCRIT: 33.9 % — AB (ref 36.0–46.0)
Hemoglobin: 11.4 g/dL — ABNORMAL LOW (ref 12.0–15.0)
MCH: 27.6 pg (ref 26.0–34.0)
MCHC: 33.6 g/dL (ref 30.0–36.0)
MCV: 82.1 fL (ref 78.0–100.0)
Platelets: 331 10*3/uL (ref 150–400)
RBC: 4.13 MIL/uL (ref 3.87–5.11)
RDW: 14.8 % (ref 11.5–15.5)
WBC: 9.8 10*3/uL (ref 4.0–10.5)

## 2014-05-27 LAB — I-STAT TROPONIN, ED: Troponin i, poc: 0 ng/mL (ref 0.00–0.08)

## 2014-05-27 LAB — D-DIMER, QUANTITATIVE (NOT AT ARMC): D DIMER QUANT: 0.7 ug{FEU}/mL — AB (ref 0.00–0.48)

## 2014-05-27 LAB — PREGNANCY, URINE: Preg Test, Ur: NEGATIVE

## 2014-05-27 MED ORDER — DIPHENHYDRAMINE HCL 50 MG/ML IJ SOLN
25.0000 mg | Freq: Once | INTRAMUSCULAR | Status: AC
  Administered 2014-05-27: 25 mg via INTRAVENOUS
  Filled 2014-05-27: qty 1

## 2014-05-27 MED ORDER — IOHEXOL 350 MG/ML SOLN
100.0000 mL | Freq: Once | INTRAVENOUS | Status: AC | PRN
  Administered 2014-05-27: 100 mL via INTRAVENOUS

## 2014-05-27 MED ORDER — METHYLPREDNISOLONE SODIUM SUCC 125 MG IJ SOLR
125.0000 mg | Freq: Once | INTRAMUSCULAR | Status: AC
  Administered 2014-05-27: 125 mg via INTRAVENOUS
  Filled 2014-05-27: qty 2

## 2014-05-27 MED ORDER — MORPHINE SULFATE 2 MG/ML IJ SOLN
2.0000 mg | Freq: Once | INTRAMUSCULAR | Status: AC
  Administered 2014-05-27: 2 mg via INTRAVENOUS
  Filled 2014-05-27: qty 1

## 2014-05-27 MED ORDER — SODIUM CHLORIDE 0.9 % IV BOLUS (SEPSIS)
1000.0000 mL | Freq: Once | INTRAVENOUS | Status: AC
  Administered 2014-05-27: 1000 mL via INTRAVENOUS

## 2014-05-27 MED ORDER — SODIUM CHLORIDE 0.9 % IV SOLN
20.0000 mL | INTRAVENOUS | Status: DC
  Administered 2014-05-27: 20 mL via INTRAVENOUS

## 2014-05-27 MED ORDER — KETOROLAC TROMETHAMINE 30 MG/ML IJ SOLN
15.0000 mg | Freq: Once | INTRAMUSCULAR | Status: AC
  Administered 2014-05-27: 15 mg via INTRAVENOUS
  Filled 2014-05-27: qty 1

## 2014-05-27 MED ORDER — AMOXICILLIN 500 MG PO CAPS: 500.0000 mg | ORAL_CAPSULE | Freq: Three times a day (TID) | ORAL | Status: DC

## 2014-05-27 MED ORDER — AMOXICILLIN 500 MG PO CAPS
500.0000 mg | ORAL_CAPSULE | Freq: Once | ORAL | Status: AC
  Administered 2014-05-28: 500 mg via ORAL
  Filled 2014-05-27: qty 1

## 2014-05-27 MED ORDER — FLUTICASONE PROPIONATE 50 MCG/ACT NA SUSP: 2.0000 | Freq: Every day | NASAL | Status: DC

## 2014-05-27 MED ORDER — BUTALBITAL-APAP-CAFFEINE 50-325-40 MG PO TABS: 1.0000 | ORAL_TABLET | Freq: Four times a day (QID) | ORAL | Status: AC | PRN

## 2014-05-27 MED ORDER — ASPIRIN 81 MG PO CHEW
324.0000 mg | CHEWABLE_TABLET | Freq: Once | ORAL | Status: AC
  Administered 2014-05-27: 324 mg via ORAL
  Filled 2014-05-27: qty 4

## 2014-05-27 MED ORDER — METOCLOPRAMIDE HCL 5 MG/ML IJ SOLN
10.0000 mg | Freq: Once | INTRAMUSCULAR | Status: AC
  Administered 2014-05-27: 10 mg via INTRAVENOUS
  Filled 2014-05-27: qty 2

## 2014-05-27 NOTE — ED Notes (Signed)
Presents with congestion and cough associated with a headache and bodyaches. "this feels like a sinus infection I have had before" endorses runny nose and productive cough.

## 2014-05-27 NOTE — ED Provider Notes (Signed)
CSN: [id number]     Arrival date & time 05/27/14  1821 History   First MD Initiated Contact with Patient 05/27/14 1903     Chief Complaint  Patient presents with  . Nasal Congestion  . Headache  . Cough     (Consider location/radiation/quality/duration/timing/severity/associated sxs/prior Treatment) HPI Comments: 5f presents for eval of chest pain.  She has had headaches, dizziness/disequilibrium, cough, SOB, nasal congestion, rhinorrhea for a few days.  Starting today she began to have central, substernal CP that is described as discomfort/squeezing.  She has also been experiencing SOB and nausea.  CP is non-radiating. It has been constant and she is unsure if it is worsened by exertion.  She also admits to some associated weakness and fatigue.  She is concerned that she may have a sinus infection, but she is most concerned about the chest pain.  No fever at home.  No Hx of heart problems.  No FH of ACS at a young age.  She also admits to some swelling of her BLE, with some pain in her left calf and knee.  She recently drove to [place] earlier this month for her birthday.    Patient is a 43 y.o. female presenting with headaches and cough.  Headache Associated symptoms: congestion, cough, fatigue, nausea and sinus pressure   Associated symptoms: no abdominal pain, no ear pain, no fever, no sore throat and no vomiting   Cough Associated symptoms: chest pain, headaches and shortness of breath   Associated symptoms: no chills, no ear pain, no fever, no sore throat and no wheezing     Past Medical History  Diagnosis Date  . Asthma    History reviewed. No pertinent past surgical history. Family History  Problem Relation Age of Onset  . Diabetes Mother    History  Substance Use Topics  . Smoking status: Never Smoker   . Smokeless tobacco: Not on file  . Alcohol Use: No   OB History   Grav Para Term Preterm Abortions TAB SAB Ect Mult Living                 Review of Systems   Constitutional: Positive for fatigue. Negative for fever and chills.  HENT: Positive for congestion and sinus pressure. Negative for ear pain and sore throat.   Respiratory: Positive for cough, chest tightness and shortness of breath. Negative for wheezing.   Cardiovascular: Positive for chest pain. Negative for palpitations and leg swelling.  Gastrointestinal: Positive for nausea. Negative for vomiting and abdominal pain.  Neurological: Positive for weakness and headaches.  All other systems reviewed and are negative.     Allergies  Review of patient's allergies indicates no known allergies.  Home Medications   Prior to Admission medications   Medication Sig Start Date End Date Taking? Authorizing Provider  acetaminophen (TYLENOL) 500 MG tablet Take 500 mg by mouth every 6 (six) hours as needed for mild pain.   Yes Historical Provider, MD  fluticasone (FLONASE) 50 MCG/ACT nasal spray Place 1 spray into both nostrils daily as needed for allergies or rhinitis.   Yes Historical Provider, MD  Multiple Vitamins-Minerals (ZINC PO) Take 1 tablet by mouth daily.   Yes Historical Provider, MD  Olopatadine HCl (PATADAY OP) Place 1 drop into both eyes daily.   Yes Historical Provider, MD  VITAMIN D, CHOLECALCIFEROL, PO Take 1 tablet by mouth daily.   Yes Historical Provider, MD   BP 140/72  Pulse 74  Temp(Src) 97.9 F (36.6 C) (  Oral)  Resp 20  Ht  (1.626 m)  Wt 188 lb (85.276 kg)  BMI 32.25 kg/m2  SpO2 100% Physical Exam  Nursing note and vitals reviewed. Constitutional: She is oriented to person, place, and time. Vital signs are normal. She appears well-developed and well-nourished. No distress.  HENT:  Head: Normocephalic and atraumatic.  Right Ear: Tympanic membrane, external ear and ear canal normal.  Left Ear: Tympanic membrane, external ear and ear canal normal.  Nose: Nose normal. Right sinus exhibits no maxillary sinus tenderness and no frontal sinus tenderness. Left  sinus exhibits no maxillary sinus tenderness and no frontal sinus tenderness.  Mouth/Throat: Uvula is midline.  Eyes: Conjunctivae are normal. Right eye exhibits no discharge. Left eye exhibits no discharge.  Cardiovascular: Normal rate, regular rhythm, normal heart sounds and intact distal pulses.  Exam reveals no gallop and no friction rub.   No murmur heard. Pulmonary/Chest: Effort normal and breath sounds normal. No respiratory distress. She has no wheezes. She has no rales. She exhibits tenderness (mild, sternal ).  Musculoskeletal:  Mild tenderness with palpation of the left calf and left calf pain with passive dorsiflexion. Mildly TTP right ankle as well   Neurological: She is alert and oriented to person, place, and time. She has normal strength. No cranial nerve deficit. Coordination normal.  Skin: Skin is warm and dry. No rash noted. She is not diaphoretic.  Psychiatric: She has a normal mood and affect. Judgment normal.    ED Course  ED EKG  Date/Time: 05/27/2014 8:09 PM Performed by: Autumn Messing, H Authorized by: Autumn Messing, H Previous ECG: no previous ECG available Rhythm: sinus rhythm Rate: normal QRS axis: normal Conduction: conduction normal ST Segments: ST segments normal T Waves: T waves normal Other: no other findings Clinical impression: normal ECG   (including critical care time) Labs Review Labs Reviewed  CBC  COMPREHENSIVE METABOLIC PANEL  I-STAT TROPOININ, ED    Imaging Review No results found.   EKG Interpretation None      MDM   Final diagnoses:  None    Pt is here for eval of CP, associated with SOB and nausea, may be due to URI but need to r/o cardiac etiology.  Will initiate workup and transfer over to main side.      Graylon Good, PA-C 05/27/14 2023

## 2014-05-27 NOTE — Discharge Instructions (Signed)
Use nasal saline (you can try [employer] Simply Saline) at least 4 times a day, use saline 5-10 minutes before using the fluticasone (flonase) ° °Do not use Afrin (Oxymetazoline) ° °Rest, wash hands frequently  and drink plenty of water. ° °Take your antibiotics as directed and to completion. You should never have any leftover antibiotics! Push fluids and stay well hydrated.  ° °Please follow with your primary care doctor in the next 2 days for a check-up. They must obtain records for further management.  ° °Do not hesitate to return to the Emergency Department for any new, worsening or concerning symptoms.  ° ° °Sinusitis °Sinusitis is redness, soreness, and inflammation of the paranasal sinuses. Paranasal sinuses are air pockets within the bones of your face (beneath the eyes, the middle of the forehead, or above the eyes). In healthy paranasal sinuses, mucus is able to drain out, and air is able to circulate through them by way of your nose. However, when your paranasal sinuses are inflamed, mucus and air can become trapped. This can allow bacteria and other germs to grow and cause infection. °Sinusitis can develop quickly and last only a short time (acute) or continue over a long period (chronic). Sinusitis that lasts for more than 12 weeks is considered chronic.  °CAUSES  °Causes of sinusitis include: °· Allergies. °· Structural abnormalities, such as displacement of the cartilage that separates your nostrils (deviated septum), which can decrease the air flow through your nose and sinuses and affect sinus drainage. °· Functional abnormalities, such as when the small hairs (cilia) that line your sinuses and help remove mucus do not work properly or are not present. °SIGNS AND SYMPTOMS  °Symptoms of acute and chronic sinusitis are the same. The primary symptoms are pain and pressure around the affected sinuses. Other symptoms include: °· Upper toothache. °· Earache. °· Headache. °· Bad breath. °· Decreased  sense of smell and taste. °· A cough, which worsens when you are lying flat. °· Fatigue. °· Fever. °· Thick drainage from your nose, which often is green and may contain pus (purulent). °· Swelling and warmth over the affected sinuses. °DIAGNOSIS  °Your health care provider will perform a physical exam. During the exam, your health care provider may: °· Look in your nose for signs of abnormal growths in your nostrils (nasal polyps). °· Tap over the affected sinus to check for signs of infection. °· View the inside of your sinuses (endoscopy) using an imaging device that has a light attached (endoscope). °If your health care provider suspects that you have chronic sinusitis, one or more of the following tests may be recommended: °· Allergy tests. °· Nasal culture. A sample of mucus is taken from your nose, sent to a lab, and screened for bacteria. °· Nasal cytology. A sample of mucus is taken from your nose and examined by your health care provider to determine if your sinusitis is related to an allergy. °TREATMENT  °Most cases of acute sinusitis are related to a viral infection and will resolve on their own within 10 days. Sometimes medicines are prescribed to help relieve symptoms (pain medicine, decongestants, nasal steroid sprays, or saline sprays).  °However, for sinusitis related to a bacterial infection, your health care provider will prescribe antibiotic medicines. These are medicines that will help kill the bacteria causing the infection.  °Rarely, sinusitis is caused by a fungal infection. In theses cases, your health care provider will prescribe antifungal medicine. °For some cases of chronic sinusitis, surgery   is needed. Generally, these are cases in which sinusitis recurs more than 3 times per year, despite other treatments. HOME CARE INSTRUCTIONS   Drink plenty of water. Water helps thin the mucus so your sinuses can drain more easily.  Use a humidifier.  Inhale steam 3 to 4 times a day (for  example, sit in the bathroom with the shower running).  Apply a warm, moist washcloth to your face 3 to 4 times a day, or as directed by your health care provider.  Use saline nasal sprays to help moisten and clean your sinuses.  Take medicines only as directed by your health care provider.  If you were prescribed either an antibiotic or antifungal medicine, finish it all even if you start to feel better. SEEK IMMEDIATE MEDICAL CARE IF:  You have increasing pain or severe headaches.  You have nausea, vomiting, or drowsiness.  You have swelling around your face.  You have vision problems.  You have a stiff neck.  You have difficulty breathing. MAKE SURE YOU:   Understand these instructions.  Will watch your condition.  Will get help right away if you are not doing well or get worse. Document Released: 09/17/2005 Document Revised: 02/01/2014 Document Reviewed: 10/02/2011 [facility] Patient Information 2015 [place], [place]. This information is not intended to replace advice given to you by your health care provider. Make sure you discuss any questions you have with your health care provider.

## 2014-05-27 NOTE — ED Notes (Signed)
To room 1

## 2014-05-27 NOTE — ED Provider Notes (Signed)
PROGRESS NOTE                                                                                                                 This is a sign-out from PA Baker: Tammie Williams is a 43 y.o. female presenting with cough, rhinorrhea, calf tenderness and chest pain. Chest x-ray with no infiltrate, EKG is nonischemic, troponin is negative. Plan is to followup d-dimer, patient was given 2 mg of morphine and to reassess pain.  Please refer to previous note for full HPI, ROS, PMH and PE.   Patient has a heart score of 0.   Vision seen and evaluated the bedside, states that headache is severe, not relieved by morphine. States she has 5/10 right-sided pleuritic chest pain with productive cough. Patient endorses sinus pressure and congestion with drainage as well. Denies fever or chills. Patient has been taking acetaminophen. Patient states that she has bilateral lower extremity calf pain. States that she drove for 10 hours on August 2. Patient denies hemoptysis, history of DVT or PE. Lung sounds clear to auscultation bilaterally, no wheezing rhonchi Rales. Heart is regular rate and rhythm with no murmurs rubs or gallops. Abdominal exam is benign with no tenderness palpation guarding or rebound. No calf asymmetry, superficial collaterals, palpable cords, edema, Homans sign negative bilaterally. Bilateral tympanic membranes with normal architecture and good light reflex. Posterior pharynx is mildly injected. Nasal mucosa is edematous, and very swollen with poor patency.  Headache resolved after headache cocktail. Mildly elevated d-dimer is 0.7. CT chest pending  CT chest shows no signs of pulmonary embolism. Patient will be treated for acute sinusitis.  Filed Vitals:   05/27/14 2045 05/27/14 2100 05/27/14 2130 05/28/14 0005  BP: 131/86 125/71 133/84 121/67  Pulse: 78 76 78 86  Temp:      TempSrc:      Resp: Height:      Weight:      SpO2: 100% 100% 99% 100%    Medications  0.9 %  sodium  chloride infusion (0 mLs Intravenous Stopped 05/27/14 2113)  aspirin chewable tablet 324 mg (324 mg Oral Given 05/27/14 1959)  morphine 2 MG/ML injection 2 mg (2 mg Intravenous Given 05/27/14 1959)  sodium chloride 0.9 % bolus 1,000 mL (0 mLs Intravenous Stopped 05/28/14 0011)  methylPREDNISolone sodium succinate (SOLU-MEDROL) 125 mg/2 mL injection 125 mg (125 mg Intravenous Given 05/27/14 2114)  metoCLOPramide (REGLAN) injection 10 mg (10 mg Intravenous Given 05/27/14 2118)  diphenhydrAMINE (BENADRYL) injection 25 mg (25 mg Intravenous Given 05/27/14 2114)  ketorolac (TORADOL) 30 MG/ML injection 15 mg (15 mg Intravenous Given 05/27/14 2117)  iohexol (OMNIPAQUE) 350 MG/ML injection 100 mL (100 mLs Intravenous Contrast Given 05/27/14 2309)  amoxicillin (AMOXIL) capsule 500 mg (500 mg Oral Given 05/28/14 0002)    Evaluation does not show pathology that would require ongoing emergent intervention or inpatient treatment. Pt is hemodynamically stable and mentating appropriately. Discussed findings and plan with patient/guardian, who agrees with care plan. All questions answered.  Return precautions discussed and outpatient follow up given.   Discharge Medication List as of 05/27/2014 11:55 PM    START taking these medications   Details  amoxicillin (AMOXIL) 500 MG capsule Take 1 capsule (500 mg total) by mouth 3 (three) times daily., Starting 05/27/2014, Until Discontinued, Print    butalbital-acetaminophen-caffeine (FIORICET) 50-325-40 MG per tablet Take 1 tablet by mouth every 6 (six) hours as needed for headache., Starting 05/27/2014, Until Fri 05/27/15, Print    !! fluticasone (FLONASE) 50 MCG/ACT nasal spray Place 2 sprays into both nostrils daily., Starting 05/27/2014, Until Discontinued, Print     !! - Potential duplicate medications found. Please discuss with provider.              Wynetta Emery, PA-C 05/28/14 [phone]

## 2014-05-27 NOTE — ED Notes (Signed)
Patient has complaint of chest pain. Charge notified to move patient to another room.

## 2014-05-28 NOTE — ED Provider Notes (Signed)
Medical screening examination/treatment/procedure(s) were performed by non-physician practitioner and as supervising physician I was immediately available for consultation/collaboration.  Leslee Home, M.D.  Reuben Likes, MD 05/28/14 1409

## 2014-05-28 NOTE — ED Provider Notes (Signed)
Medical screening examination/treatment/procedure(s) were performed by non-physician practitioner and as supervising physician I was immediately available for consultation/collaboration.   EKG Interpretation None        Elwin Mocha, MD 05/28/14 [phone]

## 2014-06-05 ENCOUNTER — Encounter (HOSPITAL_COMMUNITY): Payer: Self-pay | Admitting: Emergency Medicine

## 2014-06-05 ENCOUNTER — Emergency Department (HOSPITAL_COMMUNITY)
Admission: EM | Admit: 2014-06-05 | Discharge: 2014-06-05 | Disposition: A | Discharge status: Home / Self Care | Payer: Medicaid Other | Attending: Emergency Medicine | Admitting: Emergency Medicine

## 2014-06-05 DIAGNOSIS — IMO0002 Reserved for concepts with insufficient information to code with codable children: Secondary | ICD-10-CM | POA: Insufficient documentation

## 2014-06-05 DIAGNOSIS — Z87828 Personal history of other (healed) physical injury and trauma: Secondary | ICD-10-CM | POA: Insufficient documentation

## 2014-06-05 DIAGNOSIS — M79605 Pain in left leg: Secondary | ICD-10-CM

## 2014-06-05 DIAGNOSIS — Z79899 Other long term (current) drug therapy: Secondary | ICD-10-CM | POA: Insufficient documentation

## 2014-06-05 DIAGNOSIS — J45909 Unspecified asthma, uncomplicated: Secondary | ICD-10-CM | POA: Insufficient documentation

## 2014-06-05 DIAGNOSIS — Z792 Long term (current) use of antibiotics: Secondary | ICD-10-CM | POA: Insufficient documentation

## 2014-06-05 DIAGNOSIS — M79609 Pain in unspecified limb: Secondary | ICD-10-CM | POA: Insufficient documentation

## 2014-06-05 DIAGNOSIS — M546 Pain in thoracic spine: Secondary | ICD-10-CM | POA: Insufficient documentation

## 2014-06-05 DIAGNOSIS — M79604 Pain in right leg: Secondary | ICD-10-CM

## 2014-06-05 DIAGNOSIS — M62838 Other muscle spasm: Secondary | ICD-10-CM | POA: Insufficient documentation

## 2014-06-05 LAB — I-STAT CHEM 8, ED
BUN: 10 mg/dL (ref 6–23)
CHLORIDE: 106 meq/L (ref 96–112)
CREATININE: 0.6 mg/dL (ref 0.50–1.10)
Calcium, Ion: 1.27 mmol/L — ABNORMAL HIGH (ref 1.12–1.23)
Glucose, Bld: 132 mg/dL — ABNORMAL HIGH (ref 70–99)
HEMATOCRIT: 37 % (ref 36.0–46.0)
Hemoglobin: 12.6 g/dL (ref 12.0–15.0)
POTASSIUM: 3.8 meq/L (ref 3.7–5.3)
Sodium: 138 mEq/L (ref 137–147)
TCO2: 21 mmol/L (ref 0–100)

## 2014-06-05 MED ORDER — DIAZEPAM 5 MG PO TABS: 5.0000 mg | ORAL_TABLET | Freq: Two times a day (BID) | ORAL | Status: DC

## 2014-06-05 MED ORDER — HYDROCODONE-ACETAMINOPHEN 5-325 MG PO TABS: 1.0000 | ORAL_TABLET | ORAL | Status: DC | PRN

## 2014-06-05 NOTE — ED Notes (Signed)
Pt has Acute on Chronic Pain to lower back and Legs. Pt reports a knee injury in 2012 .

## 2014-06-05 NOTE — ED Provider Notes (Signed)
CSN: [id number]     Arrival date & time 06/05/14  1216 History  This chart was scribed for Tammie Anis, PA, working with Tammie Barrette, MD found by Tammie Williams, ED Scribe. This patient was seen in room TR05C/TR05C and the patient's care was started at 12:53 PM.   No chief complaint on file.  Patient is a 43 y.o. female presenting with leg pain. The history is provided by the patient. No language interpreter was used.  Leg Pain Location:  Leg Time since incident:  2 weeks Injury: no   Leg location:  R leg and L leg Pain details:    Radiates to:  Back   Severity:  Moderate   Onset quality:  Gradual   Duration:  2 weeks   Timing:  Intermittent   Progression:  Partially resolved Chronicity:  New Dislocation: no   Foreign body present:  No foreign bodies Relieved by:  Nothing Worsened by:  Nothing tried Ineffective treatments:  Heat, acetaminophen and NSAIDs Associated symptoms: back pain, muscle weakness and stiffness   Associated symptoms: no fever     HPI Comments: Tammie Williams is a 43 y.o. female with history of left knee injury, chronic back pain who presents to the Emergency Department complaining of intermittent anterior bilateral left lower extremity cramping below the knee onset this morning.  She also reports two weeks of associated pain into her upper legs and lower back.  Patient reports she is trying to eat more food recently because she had been eating less due to stress.  Patient has a history of anaemia.  Patient reports some recent fatigue and heavy periods.   Patient reports taking Tylenol, Meloxicam, and using heating pad with no relief.  Patient denies fever, injury, abdominal pain, bowel/bladder incontinece.    Past Medical History  Diagnosis Date  . Asthma    No past surgical history on file. Family History  Problem Relation Age of Onset  . Diabetes Mother    History  Substance Use Topics  . Smoking status: Never Smoker   . Smokeless tobacco: Not on  file  . Alcohol Use: No   OB History   Grav Para Term Preterm Abortions TAB SAB Ect Mult Living                 Review of Systems  Constitutional: Negative for fever.  Cardiovascular: Negative for leg swelling.  Musculoskeletal: Positive for back pain and stiffness.       See HPI.  Neurological: Negative for weakness and numbness.  All other systems reviewed and are negative.     Allergies  Review of patient's allergies indicates no known allergies.  Home Medications   Prior to Admission medications   Medication Sig Start Date End Date Taking? Authorizing Provider  acetaminophen (TYLENOL) 500 MG tablet Take 500 mg by mouth every 6 (six) hours as needed for mild pain.    Historical Provider, MD  amoxicillin (AMOXIL) 500 MG capsule Take 1 capsule (500 mg total) by mouth 3 (three) times daily. 05/27/14   Nicole Pisciotta, PA-C  butalbital-acetaminophen-caffeine (FIORICET) [phone] MG per tablet Take 1 tablet by mouth every 6 (six) hours as needed for headache. 05/27/14 05/27/15  Joni Reining Pisciotta, PA-C  fluticasone (FLONASE) 50 MCG/ACT nasal spray Place 1 spray into both nostrils daily as needed for allergies or rhinitis.    Historical Provider, MD  fluticasone (FLONASE) 50 MCG/ACT nasal spray Place 2 sprays into both nostrils daily. 05/27/14   Wynetta Emery, PA-C  Multiple  Vitamins-Minerals (ZINC PO) Take 1 tablet by mouth daily.    Historical Provider, MD  Olopatadine HCl (PATADAY OP) Place 1 drop into both eyes daily.    Historical Provider, MD  VITAMIN D, CHOLECALCIFEROL, PO Take 1 tablet by mouth daily.    Historical Provider, MD   LMP 05/13/2014 Physical Exam  Nursing note and vitals reviewed. Constitutional: She is oriented to person, place, and time. She appears well-developed and well-nourished. No distress.  HENT:  Head: Normocephalic and atraumatic.  Eyes: Conjunctivae and EOM are normal.  Neck: Neck supple. No tracheal deviation present.  Cardiovascular: Normal  rate.   Pulmonary/Chest: Effort normal. No respiratory distress.  Genitourinary:  No CVA tenderness.  Musculoskeletal: Normal range of motion.  Bilateral thoracic tenderness with no swelling.  No reproducible lumbar or paralumbar tenderness.  Lower extremities are unremarkable in appearance with no swelling.  Generalized muscular tenderness.  Distal pulses intact.  Joints stable.   Neurological: She is alert and oriented to person, place, and time.  Skin: Skin is warm and dry.  Psychiatric: She has a normal mood and affect. Her behavior is normal.    ED Course  Procedures (including critical care time)  DIAGNOSTIC STUDIES: Oxygen Saturation is 100% on RA, normal by my interpretation.    COORDINATION OF CARE:  12:58 PM Discussed treatment plan with patient at bedside.  Patient acknowledges and agrees with plan.    Labs Review Labs Reviewed - No data to display  Imaging Review No results found.   EKG Interpretation None      MDM   Final diagnoses:  None    1. bilateral leg pain 2. Muscle spasm   Normal Hgb and normal potassium - nothing to explain cause of lower extremity pain and spasm. Will treat pain, provide muscle relaxer and encourage PCP follow up. .  I personally performed the services described in this documentation, which was scribed in my presence. The recorded information has been reviewed and is accurate.     Arnoldo Hooker, PA-C 06/05/14 1330

## 2014-06-05 NOTE — Discharge Instructions (Signed)
Heat Therapy °Heat therapy can help ease sore, stiff, injured, and tight muscles and joints. Heat relaxes your muscles, which may help ease your pain.  °RISKS AND COMPLICATIONS °If you have any of the following conditions, do not use heat therapy unless your health care provider has approved: °· Poor circulation. °· Healing wounds or scarred skin in the area being treated. °· Diabetes, heart disease, or high blood pressure. °· Not being able to feel (numbness) the area being treated. °· Unusual swelling of the area being treated. °· Active infections. °· Blood clots. °· Cancer. °· Inability to communicate pain. This may include young children and people who have problems with their brain function (dementia). °· Pregnancy. °Heat therapy should only be used on old, pre-existing, or long-lasting (chronic) injuries. Do not use heat therapy on new injuries unless directed by your health care provider. °HOW TO USE HEAT THERAPY °There are several different kinds of heat therapy, including: °· Moist heat pack. °· Warm water bath. °· Hot water bottle. °· Electric heating pad. °· Heated gel pack. °· Heated wrap. °· Electric heating pad. °Use the heat therapy method suggested by your health care provider. Follow your health care provider's instructions on when and how to use heat therapy. °GENERAL HEAT THERAPY RECOMMENDATIONS °· Do not sleep while using heat therapy. Only use heat therapy while you are awake. °· Your skin may turn pink while using heat therapy. Do not use heat therapy if your skin turns red. °· Do not use heat therapy if you have new pain. °· High heat or long exposure to heat can cause burns. Be careful when using heat therapy to avoid burning your skin. °· Do not use heat therapy on areas of your skin that are already irritated, such as with a rash or sunburn. °SEEK MEDICAL CARE IF: °· You have blisters, redness, swelling, or numbness. °· You have new pain. °· Your pain is worse. °MAKE SURE  YOU: °· Understand these instructions. °· Will watch your condition. °· Will get help right away if you are not doing well or get worse. °Document Released: 12/10/2011 Document Revised: 02/01/2014 Document Reviewed: 11/10/2013 °[employer]® Patient Information ©2015 [employer]. This information is not intended to replace advice given to you by your health care provider. Make sure you discuss any questions you have with your health care provider. °Muscle Cramps and Spasms °Muscle cramps and spasms occur when a muscle or muscles tighten and you have no control over this tightening (involuntary muscle contraction). They are a common problem and can develop in any muscle. The most common place is in the calf muscles of the leg. Both muscle cramps and muscle spasms are involuntary muscle contractions, but they also have differences:  °· Muscle cramps are sporadic and painful. They may last a few seconds to a quarter of an hour. Muscle cramps are often more forceful and last longer than muscle spasms. °· Muscle spasms may or may not be painful. They may also last just a few seconds or much longer. °CAUSES  °It is uncommon for cramps or spasms to be due to a serious underlying problem. In many cases, the cause of cramps or spasms is unknown. Some common causes are:  °· Overexertion.   °· Overuse from repetitive motions (doing the same thing over and over).   °· Remaining in a certain position for a long period of time.   °· Improper preparation, form, or technique while performing a sport or activity.   °· Dehydration.   °· Injury.   °·   Side effects of some medicines.   °· Abnormally low levels of the salts and ions in your blood (electrolytes), especially potassium and calcium. This could happen if you are taking water pills (diuretics) or you are pregnant.   °Some underlying medical problems can make it more likely to develop cramps or spasms. These include, but are not limited to:  °· Diabetes.   °· Parkinson disease.    °· Hormone disorders, such as thyroid problems.   °· Alcohol abuse.   °· Diseases specific to muscles, joints, and bones.   °· Blood vessel disease where not enough blood is getting to the muscles.   °HOME CARE INSTRUCTIONS  °· Stay well hydrated. Drink enough water and fluids to keep your urine clear or pale yellow. °· It may be helpful to massage, stretch, and relax the affected muscle. °· For tight or tense muscles, use a warm towel, heating pad, or hot shower water directed to the affected area. °· If you are sore or have pain after a cramp or spasm, applying ice to the affected area may relieve discomfort. °· Put ice in a plastic bag. °· Place a towel between your skin and the bag. °· Leave the ice on for 15-20 minutes, 03-04 times a day. °· Medicines used to treat a known cause of cramps or spasms may help reduce their frequency or severity. Only take over-the-counter or prescription medicines as directed by your caregiver. °SEEK MEDICAL CARE IF:  °Your cramps or spasms get more severe, more frequent, or do not improve over time.  °MAKE SURE YOU:  °· Understand these instructions. °· Will watch your condition. °· Will get help right away if you are not doing well or get worse. °Document Released: 03/09/2002 Document Revised: 01/12/2013 Document Reviewed: 09/03/2012 °ExitCare® Patient Information ©2015 [employer]. This information is not intended to replace advice given to you by your health care provider. Make sure you discuss any questions you have with your health care provider. ° °

## 2014-06-08 NOTE — ED Provider Notes (Signed)
Medical screening examination/treatment/procedure(s) were performed by non-physician practitioner and as supervising physician I was immediately available for consultation/collaboration.   EKG Interpretation None       Arby Barrette, MD 06/08/14 [phone]

## 2014-08-17 DIAGNOSIS — D509 Iron deficiency anemia, unspecified: Secondary | ICD-10-CM | POA: Insufficient documentation

## 2015-07-25 ENCOUNTER — Inpatient Hospital Stay (HOSPITAL_COMMUNITY)
Admission: EM | Admit: 2015-07-25 | Discharge: 2015-07-25 | Disposition: A | Discharge status: Home / Self Care | Payer: Medicaid Other | Source: Ambulatory Visit | Attending: Family Medicine | Admitting: Family Medicine

## 2015-07-25 ENCOUNTER — Encounter (HOSPITAL_COMMUNITY): Payer: Self-pay | Admitting: *Deleted

## 2015-07-25 DIAGNOSIS — N946 Dysmenorrhea, unspecified: Secondary | ICD-10-CM | POA: Diagnosis not present

## 2015-07-25 DIAGNOSIS — D573 Sickle-cell trait: Secondary | ICD-10-CM | POA: Insufficient documentation

## 2015-07-25 HISTORY — DX: Sickle-cell trait: D57.3

## 2015-07-25 HISTORY — DX: Vitamin D deficiency, unspecified: E55.9

## 2015-07-25 HISTORY — DX: Anemia, unspecified: D64.9

## 2015-07-25 LAB — URINE MICROSCOPIC-ADD ON

## 2015-07-25 LAB — URINALYSIS, ROUTINE W REFLEX MICROSCOPIC
BILIRUBIN URINE: NEGATIVE
Glucose, UA: NEGATIVE mg/dL
KETONES UR: NEGATIVE mg/dL
LEUKOCYTES UA: NEGATIVE
Nitrite: NEGATIVE
PH: 6 (ref 5.0–8.0)
Protein, ur: 100 mg/dL — AB
Specific Gravity, Urine: 1.02 (ref 1.005–1.030)
Urobilinogen, UA: 0.2 mg/dL (ref 0.0–1.0)

## 2015-07-25 LAB — POCT PREGNANCY, URINE: Preg Test, Ur: NEGATIVE

## 2015-07-25 MED ORDER — IBUPROFEN 800 MG PO TABS: 800.0000 mg | ORAL_TABLET | Freq: Three times a day (TID) | ORAL | Status: DC

## 2015-07-25 MED ORDER — IBUPROFEN 800 MG PO TABS
800.0000 mg | ORAL_TABLET | Freq: Once | ORAL | Status: AC
  Administered 2015-07-25: 800 mg via ORAL
  Filled 2015-07-25: qty 1

## 2015-07-25 MED ORDER — CYCLOBENZAPRINE HCL 10 MG PO TABS: 10.0000 mg | ORAL_TABLET | Freq: Three times a day (TID) | ORAL | Status: DC | PRN

## 2015-07-25 NOTE — MAU Provider Note (Signed)
History   pt in with c/o abd pain that started 2-3 days ago. Pt states she started her period today and is having mild abd cramping. States pain has improved since she started her period.  CSN: [id number]  Arrival date and time: 07/25/15 1631   First Provider Initiated Contact with Patient 07/25/15 1715      No chief complaint on file.  HPI  OB History    Gravida Para Term Preterm AB TAB SAB Ectopic Multiple Living   Past Medical History  Diagnosis Date  . Asthma   . Sickle cell trait (HCC)   . Anemia   . Vitamin D deficiency     Past Surgical History  Procedure Laterality Date  . Cesarean section    . Wisdom tooth extraction      Family History  Problem Relation Age of Onset  . Diabetes Mother     Social History  Substance Use Topics  . Smoking status: Never Smoker   . Smokeless tobacco: None  . Alcohol Use: No    Allergies: No Known Allergies  Prescriptions prior to admission  Medication Sig Dispense Refill Last Dose  . acetaminophen (TYLENOL) 500 MG tablet Take 500 mg by mouth every 6 (six) hours as needed for mild pain.   05/27/2014 at Unknown time  . amoxicillin (AMOXIL) 500 MG capsule Take 1 capsule (500 mg total) by mouth 3 (three) times daily. 30 capsule 0   . diazepam (VALIUM) 5 MG tablet Take 1 tablet (5 mg total) by mouth 2 (two) times daily. 10 tablet 0   . fluticasone (FLONASE) 50 MCG/ACT nasal spray Place 1 spray into both nostrils daily as needed for allergies or rhinitis.   05/27/2014 at Unknown time  . fluticasone (FLONASE) 50 MCG/ACT nasal spray Place 2 sprays into both nostrils daily. 16 g 0   . HYDROcodone-acetaminophen (NORCO/VICODIN) 5-325 MG per tablet Take 1-2 tablets by mouth every 4 (four) hours as needed. 12 tablet 0   . Multiple Vitamins-Minerals (ZINC PO) Take 1 tablet by mouth daily.   05/27/2014 at Unknown time  . Olopatadine HCl (PATADAY OP) Place 1 drop into both eyes daily.   05/27/2014 at Unknown time  .  VITAMIN D, CHOLECALCIFEROL, PO Take 1 tablet by mouth daily.   05/27/2014 at Unknown time    Review of Systems  Constitutional: Negative.   Eyes: Negative.   Respiratory: Negative.   Cardiovascular: Negative.   Gastrointestinal: Positive for abdominal pain.  Genitourinary: Negative.   Musculoskeletal: Negative.   Skin: Negative.   Neurological: Negative.   Endo/Heme/Allergies: Negative.   Psychiatric/Behavioral: Negative.    Physical Exam   There were no vitals taken for this visit.  Physical Exam  Constitutional: She is oriented to person, place, and time. She appears well-developed and well-nourished.  HENT:  Head: Normocephalic.  Eyes: Pupils are equal, round, and reactive to light.  Neck: Normal range of motion.  Cardiovascular: Normal rate, regular rhythm, normal heart sounds and intact distal pulses.   Respiratory: Effort normal and breath sounds normal.  GI: Soft. Bowel sounds are normal.  Genitourinary: Uterus normal.  Musculoskeletal: Normal range of motion.  Neurological: She is alert and oriented to person, place, and time. She has normal reflexes.  Skin: Skin is warm and dry.  Psychiatric: She has a normal mood and affect. Her behavior is normal. Judgment and thought content normal.    MAU Course  Procedures  MDM Dysmenorrhea   Assessment and Plan  Exam WNL, sm amt period flow, no foul odor or abd  Tenderness. Feeling better from motrin wants to go home.  Wyvonnia DuskyLAWSON, MARIE DARLENE 07/25/2015, 5:23 PM

## 2015-07-25 NOTE — Discharge Instructions (Signed)

## 2015-07-25 NOTE — MAU Note (Signed)
Pt c/o of sharp lower abd pain and nausea all last week. Cycle was one week late, but started yesterday. Denies any vag discharge and reports sharp pain and nausea is gone; now only has menstrual cramps.

## 2015-08-05 ENCOUNTER — Emergency Department (HOSPITAL_COMMUNITY)
Admission: EM | Admit: 2015-08-05 | Discharge: 2015-08-05 | Disposition: A | Discharge status: Home / Self Care | Payer: Medicaid Other | Attending: Emergency Medicine | Admitting: Emergency Medicine

## 2015-08-05 ENCOUNTER — Encounter (HOSPITAL_COMMUNITY): Payer: Self-pay | Admitting: Emergency Medicine

## 2015-08-05 ENCOUNTER — Emergency Department (HOSPITAL_COMMUNITY): Payer: Medicaid Other

## 2015-08-05 DIAGNOSIS — J45901 Unspecified asthma with (acute) exacerbation: Secondary | ICD-10-CM | POA: Insufficient documentation

## 2015-08-05 DIAGNOSIS — J019 Acute sinusitis, unspecified: Secondary | ICD-10-CM | POA: Insufficient documentation

## 2015-08-05 DIAGNOSIS — H578 Other specified disorders of eye and adnexa: Secondary | ICD-10-CM | POA: Insufficient documentation

## 2015-08-05 DIAGNOSIS — R0789 Other chest pain: Secondary | ICD-10-CM | POA: Insufficient documentation

## 2015-08-05 DIAGNOSIS — Z862 Personal history of diseases of the blood and blood-forming organs and certain disorders involving the immune mechanism: Secondary | ICD-10-CM | POA: Insufficient documentation

## 2015-08-05 DIAGNOSIS — R42 Dizziness and giddiness: Secondary | ICD-10-CM | POA: Insufficient documentation

## 2015-08-05 DIAGNOSIS — M791 Myalgia: Secondary | ICD-10-CM | POA: Insufficient documentation

## 2015-08-05 DIAGNOSIS — R0602 Shortness of breath: Secondary | ICD-10-CM | POA: Insufficient documentation

## 2015-08-05 DIAGNOSIS — E559 Vitamin D deficiency, unspecified: Secondary | ICD-10-CM | POA: Insufficient documentation

## 2015-08-05 DIAGNOSIS — Z791 Long term (current) use of non-steroidal anti-inflammatories (NSAID): Secondary | ICD-10-CM | POA: Insufficient documentation

## 2015-08-05 DIAGNOSIS — J329 Chronic sinusitis, unspecified: Secondary | ICD-10-CM

## 2015-08-05 DIAGNOSIS — Z79899 Other long term (current) drug therapy: Secondary | ICD-10-CM | POA: Insufficient documentation

## 2015-08-05 MED ORDER — AZITHROMYCIN 250 MG PO TABS: 250.0000 mg | ORAL_TABLET | Freq: Every day | ORAL | Status: DC

## 2015-08-05 NOTE — ED Provider Notes (Signed)
CSN: [id number]     Arrival date & time 08/05/15  1653 History  By signing my name below, I, Jarvis Morganaylor Ferguson, attest that this documentation has been prepared under the direction and in the presence of Roxy Horsemanobert Alesa Echevarria, PA-C Electronically Signed: Jarvis Morganaylor Ferguson, ED Scribe. 08/05/2015. 5:41 PM.      Chief Complaint  Patient presents with  . Generalized Body Aches  . Nasal Congestion    The history is provided by the patient. No language interpreter was used.    HPI Comments:  Dow AdolphShaneen D Patti is a 44 y.o. female with a h/o asthma who presents to the Emergency Department with a chief complaint of constant, moderate, generalized body aches onset 1 week. She reports associated fatigue, watery eyes, dizziness, cough with associated SOB, sinus pressure, and nasal congestion. Pt denies any alleviating or aggravating factors. Pt did not get a flu shot this year.  Pt reports she has a h/o recurrent sinus infections. Pt is a non smoker. She denies any chest pain, fever, or chills. She denies any known medication allergies.    Past Medical History  Diagnosis Date  . Asthma   . Sickle cell trait (HCC)   . Anemia   . Vitamin D deficiency    Past Surgical History  Procedure Laterality Date  . Cesarean section    . Wisdom tooth extraction     Family History  Problem Relation Age of Onset  . Diabetes Mother    Social History  Substance Use Topics  . Smoking status: Never Smoker   . Smokeless tobacco: None  . Alcohol Use: No   OB History    Gravida Para Term Preterm AB TAB SAB Ectopic Multiple Living   4 1 1  3 1 1 1  1      Review of Systems  Constitutional: Negative for fever and chills.  HENT: Positive for congestion and sinus pressure.   Eyes: Positive for discharge.  Respiratory: Positive for cough, chest tightness and shortness of breath.   Cardiovascular: Negative for chest pain.  Musculoskeletal: Positive for myalgias.  Neurological: Positive for dizziness.       Allergies  Review of patient's allergies indicates no known allergies.  Home Medications   Prior to Admission medications   Medication Sig Start Date End Date Taking? Authorizing Provider  Ascorbic Acid (VITAMIN C PO) Take 1 tablet by mouth daily.     Historical Provider, MD  cetirizine (ZYRTEC) 10 MG tablet Take 10 mg by mouth daily.    Historical Provider, MD  cyclobenzaprine (FLEXERIL) 10 MG tablet Take 1 tablet (10 mg total) by mouth 3 (three) times daily as needed for muscle spasms. 07/25/15   Montez MoritaMarie D Lawson, CNM  fluticasone (FLONASE) 50 MCG/ACT nasal spray Place 1 spray into both nostrils daily as needed for allergies or rhinitis.    Historical Provider, MD  ibuprofen (ADVIL,MOTRIN) 200 MG tablet Take 400 mg by mouth every 6 (six) hours as needed.    Historical Provider, MD  ibuprofen (ADVIL,MOTRIN) 800 MG tablet Take 1 tablet (800 mg total) by mouth 3 (three) times daily. 07/25/15   Montez MoritaMarie D Lawson, CNM  Multiple Vitamin (MULTIVITAMIN WITH MINERALS) TABS tablet Take 1 tablet by mouth daily.    Historical Provider, MD  Olopatadine HCl (PATADAY OP) Place 1 drop into both eyes daily.    Historical Provider, MD  VITAMIN D, CHOLECALCIFEROL, PO Take 1 tablet by mouth daily.    Historical Provider, MD   Triage Vitals: BP 136/76 mmHg  Pulse 93  Temp(Src) 97.7 F (36.5 C) (Oral)  Resp 18  SpO2 100%  Physical Exam  Constitutional: She is oriented to person, place, and time. She appears well-developed and well-nourished. No distress.  HENT:  Head: Normocephalic and atraumatic.  Right Ear: External ear normal.  Left Ear: External ear normal.  Mouth/Throat: Oropharynx is clear and moist. No oropharyngeal exudate.  Swollen, erythematous turbinates, maxillary sinuses tender to palpation  Eyes: Conjunctivae and EOM are normal. Pupils are equal, round, and reactive to light.  Neck: Normal range of motion. Neck supple. No tracheal deviation present.  Cardiovascular: Normal rate,  regular rhythm and normal heart sounds.   Pulmonary/Chest: Effort normal and breath sounds normal. No respiratory distress. She has no wheezes. She has no rales. She exhibits no tenderness.  Abdominal: Soft. Bowel sounds are normal.  Musculoskeletal: Normal range of motion.  Neurological: She is alert and oriented to person, place, and time.  Skin: Skin is warm and dry.  Psychiatric: She has a normal mood and affect. Her behavior is normal. Judgment and thought content normal.  Nursing note and vitals reviewed.   ED Course  Procedures (including critical care time)  DIAGNOSTIC STUDIES: Oxygen Saturation is 100% on RA, normal by my interpretation.    COORDINATION OF CARE:  5:59 PM- Will order CXR.  Pt advised of plan for treatment and pt agrees.    Imaging Review Dg Chest 2 View  08/05/2015  CLINICAL DATA:  Myalgias. Headaches. Nausea and vomiting since last week. EXAM: CHEST  2 VIEW COMPARISON:  05/27/2014. FINDINGS: The heart size and mediastinal contours are within normal limits. Both lungs are clear. The visualized skeletal structures are unremarkable. IMPRESSION: Normal examination. Electronically Signed   By: Beckie Salts M.D.   On: 08/05/2015 19:14   I have personally reviewed and evaluated these images and lab results as part of my medical decision-making.   MDM   Final diagnoses:  Sinusitis, unspecified chronicity, unspecified location    I personally performed the services described in this documentation, which was scribed in my presence. The recorded information has been reviewed and is accurate.      Roxy Horseman, PA-C 08/05/15 1940  Rolland Porter, MD 08/25/15 [phone]

## 2015-08-05 NOTE — ED Notes (Signed)
Patient transported to X-ray 

## 2015-08-05 NOTE — ED Notes (Signed)
Pt sts nasal congestion and cough with body aches x 1 week

## 2015-08-05 NOTE — Discharge Instructions (Signed)

## 2016-02-16 ENCOUNTER — Encounter (HOSPITAL_COMMUNITY): Payer: Self-pay | Admitting: *Deleted

## 2016-02-16 ENCOUNTER — Emergency Department (HOSPITAL_COMMUNITY)
Admission: EM | Admit: 2016-02-16 | Discharge: 2016-02-16 | Disposition: A | Discharge status: Home / Self Care | Payer: Medicaid Other | Attending: Emergency Medicine | Admitting: Emergency Medicine

## 2016-02-16 DIAGNOSIS — Z79899 Other long term (current) drug therapy: Secondary | ICD-10-CM | POA: Insufficient documentation

## 2016-02-16 DIAGNOSIS — J45901 Unspecified asthma with (acute) exacerbation: Secondary | ICD-10-CM | POA: Insufficient documentation

## 2016-02-16 DIAGNOSIS — H9209 Otalgia, unspecified ear: Secondary | ICD-10-CM | POA: Insufficient documentation

## 2016-02-16 DIAGNOSIS — Z862 Personal history of diseases of the blood and blood-forming organs and certain disorders involving the immune mechanism: Secondary | ICD-10-CM | POA: Insufficient documentation

## 2016-02-16 DIAGNOSIS — R059 Cough, unspecified: Secondary | ICD-10-CM

## 2016-02-16 DIAGNOSIS — R0981 Nasal congestion: Secondary | ICD-10-CM

## 2016-02-16 DIAGNOSIS — R1110 Vomiting, unspecified: Secondary | ICD-10-CM | POA: Insufficient documentation

## 2016-02-16 DIAGNOSIS — K5900 Constipation, unspecified: Secondary | ICD-10-CM | POA: Insufficient documentation

## 2016-02-16 DIAGNOSIS — R05 Cough: Secondary | ICD-10-CM | POA: Insufficient documentation

## 2016-02-16 DIAGNOSIS — K0889 Other specified disorders of teeth and supporting structures: Secondary | ICD-10-CM | POA: Insufficient documentation

## 2016-02-16 DIAGNOSIS — E559 Vitamin D deficiency, unspecified: Secondary | ICD-10-CM | POA: Insufficient documentation

## 2016-02-16 DIAGNOSIS — J3489 Other specified disorders of nose and nasal sinuses: Secondary | ICD-10-CM | POA: Insufficient documentation

## 2016-02-16 MED ORDER — FLUTICASONE PROPIONATE 50 MCG/ACT NA SUSP
1.0000 | Freq: Every day | NASAL | Status: DC
  Administered 2016-02-16: 1 via NASAL
  Filled 2016-02-16: qty 16

## 2016-02-16 MED ORDER — AMOXICILLIN-POT CLAVULANATE 875-125 MG PO TABS: 1.0000 | ORAL_TABLET | Freq: Two times a day (BID) | ORAL | Status: DC

## 2016-02-16 MED ORDER — ALBUTEROL SULFATE (2.5 MG/3ML) 0.083% IN NEBU
5.0000 mg | INHALATION_SOLUTION | Freq: Once | RESPIRATORY_TRACT | Status: AC
  Administered 2016-02-16: 5 mg via RESPIRATORY_TRACT
  Filled 2016-02-16: qty 6

## 2016-02-16 MED ORDER — ALBUTEROL SULFATE HFA 108 (90 BASE) MCG/ACT IN AERS
1.0000 | INHALATION_SPRAY | Freq: Four times a day (QID) | RESPIRATORY_TRACT | Status: DC | PRN
  Administered 2016-02-16: 1 via RESPIRATORY_TRACT
  Filled 2016-02-16: qty 6.7

## 2016-02-16 NOTE — ED Notes (Signed)
PA at bedside.

## 2016-02-16 NOTE — ED Notes (Signed)
Patient presents with c/i nasal congestion and cough for several weeks

## 2016-02-16 NOTE — Discharge Instructions (Signed)
Read the information below.  Use the prescribed medication as directed.  Please discuss all new medications with your pharmacist. Take full course of antibiotics even if feeling better. Use Flonase 1-2 sprays in each nostril as needed for relief of congestion. Use inhaler 2 puffs every 6 hours as needed for shortness of breath. Be sure to follow up with your PCP if symptoms do not improve. You may return to the Emergency Department at any time for worsening condition or any new symptoms that concern you. Return to ED if your symptoms worsen or you develop fever, shortness of breath, chest pain.

## 2016-02-16 NOTE — ED Provider Notes (Signed)
CSN: [id number]     Arrival date & time 02/16/16  2036 History  By signing my name below, I, Tanda RockersMargaux Venter, attest that this documentation has been prepared under the direction and in the presence of Arvilla MeresAshley Meyer, PA-C. Electronically Signed: Tanda RockersMargaux Venter, ED Scribe. 02/16/2016. 9:13 PM.   Chief Complaint  Patient presents with  . Nasal Congestion  . Cough   The history is provided by the patient. No language interpreter was used.   HPI Comments: Tammie Williams is a 45 y.o. female with PMHx asthma, who presents to the Emergency Department complaining of gradual onset, constant, nasal congestion x 1 month. She reports that her symptoms began with seasonal allergy type symptoms including rhinorrhea, sneezing, sinus pressure, sore throat, watery eyes, post nasal drip, and ear pressure. These symptoms resolved after 1 week until pt's symptoms turned into cold like symptoms including sinus and nasal congestion with mild associated right upper molar dental pain, headache, ear pain, a cough that varies in productivity with yellow phlegm, shortness of breath, body aches, and chills 2 weeks ago. She notes difficulty breathing at night while sleeping from the sinus/nasal congestion. She also notes chest tightness and wheezing that has since resolved. Pt also complains of post tussive vomiting and constipation. She has been taking Robitussin, Sudafed, off brand Afrin nasal spray, and Zyrtec without relief. Denies fever, sweats, chest pain, nausea, abdominal pain, or any other associated symptoms.    Past Medical History  Diagnosis Date  . Asthma   . Sickle cell trait (HCC)   . Anemia   . Vitamin D deficiency    Past Surgical History  Procedure Laterality Date  . Cesarean section    . Wisdom tooth extraction     Family History  Problem Relation Age of Onset  . Diabetes Mother    Social History  Substance Use Topics  . Smoking status: Never Smoker   . Smokeless tobacco: None  . Alcohol Use:  No   OB History    Gravida Para Term Preterm AB TAB SAB Ectopic Multiple Living   4 1 1  3 1 1 1  1      Review of Systems  Constitutional: Positive for chills and fatigue. Negative for fever and diaphoresis.  HENT: Positive for congestion, ear pain, postnasal drip, rhinorrhea, sinus pressure and sneezing.   Eyes: Negative for visual disturbance.  Respiratory: Positive for cough, chest tightness and shortness of breath. Negative for wheezing.   Cardiovascular: Negative for chest pain.  Gastrointestinal: Positive for vomiting ( post-tussive) and constipation. Negative for nausea, abdominal pain, diarrhea and blood in stool.  Genitourinary: Negative for dysuria and hematuria.  Musculoskeletal: Positive for myalgias. Negative for neck pain and neck stiffness.  Neurological: Positive for headaches ( sinus).   Allergies  Review of patient's allergies indicates no known allergies.  Home Medications   Prior to Admission medications   Medication Sig Start Date End Date Taking? Authorizing Provider  Ascorbic Acid (VITAMIN C PO) Take 1 tablet by mouth daily.    Yes Historical Provider, MD  cetirizine (ZYRTEC) 10 MG tablet Take 10 mg by mouth daily.   Yes Historical Provider, MD  fluticasone (FLONASE) 50 MCG/ACT nasal spray Place 1 spray into both nostrils daily as needed for allergies or rhinitis.   Yes Historical Provider, MD  guaiFENesin (MUCINEX) 600 MG 12 hr tablet Take 600 mg by mouth 2 (two) times daily as needed for to loosen phlegm.   Yes Historical Provider, MD  ibuprofen (ADVIL,MOTRIN)  200 MG tablet Take 400 mg by mouth every 6 (six) hours as needed.   Yes Historical Provider, MD  Multiple Vitamin (MULTIVITAMIN WITH MINERALS) TABS tablet Take 1 tablet by mouth daily.   Yes Historical Provider, MD  Multiple Vitamins-Minerals (ZINC PO) Take 1 tablet by mouth daily.   Yes Historical Provider, MD  Pseudoephedrine HCl (SUDAFED 12 HOUR PO) Take 1 tablet by mouth daily as needed. For cold and  allergies   Yes Historical Provider, MD  Tetrahydrozoline HCl (VISINE OP) Place 1 drop into both eyes daily.   Yes Historical Provider, MD  VITAMIN D, CHOLECALCIFEROL, PO Take 2 tablets by mouth daily.    Yes Historical Provider, MD  amoxicillin-clavulanate (AUGMENTIN) 875-125 MG tablet Take 1 tablet by mouth 2 (two) times daily. 02/16/16   Lona Kettle, PA-C  azithromycin (ZITHROMAX Z-PAK) 250 MG tablet Take 1 tablet (250 mg total) by mouth daily. 500mg  PO day 1, then 250mg  PO days 205 Patient not taking: Reported on 02/16/2016 08/05/15   Roxy Horseman, PA-C  cyclobenzaprine (FLEXERIL) 10 MG tablet Take 1 tablet (10 mg total) by mouth 3 (three) times daily as needed for muscle spasms. Patient not taking: Reported on 02/16/2016 07/25/15   Montez Morita, CNM  ibuprofen (ADVIL,MOTRIN) 800 MG tablet Take 1 tablet (800 mg total) by mouth 3 (three) times daily. Patient not taking: Reported on 02/16/2016 07/25/15   Montez Morita, CNM  Olopatadine HCl (PATADAY OP) Place 1 drop into both eyes daily.    Historical Provider, MD   BP 149/86 mmHg  Pulse 80  Temp(Src) 97.4 F (36.3 C) (Oral)  Resp 20  Ht 5\' 4"  (1.626 m)  Wt 92.987 kg  BMI 35.17 kg/m2  SpO2 95%  LMP 02/14/2016   Physical Exam  Constitutional: She appears well-developed and well-nourished. No distress.  HENT:  Head: Normocephalic and atraumatic. Head is without right periorbital erythema and without left periorbital erythema.  Right Ear: External ear and ear canal normal. No lacerations. No drainage, swelling or tenderness. No foreign bodies. Tympanic membrane is bulging. Tympanic membrane is not injected, not perforated and not erythematous. No middle ear effusion.  Left Ear: External ear and ear canal normal. No lacerations. No drainage, swelling or tenderness. No foreign bodies. Tympanic membrane is bulging. Tympanic membrane is not injected, not perforated and not erythematous.  No middle ear effusion.  Nose: Right sinus  exhibits maxillary sinus tenderness and frontal sinus tenderness. Left sinus exhibits frontal sinus tenderness. Left sinus exhibits no maxillary sinus tenderness.  Mouth/Throat: Uvula is midline, oropharynx is clear and moist and mucous membranes are normal. No trismus in the jaw. No uvula swelling. No oropharyngeal exudate, posterior oropharyngeal edema, posterior oropharyngeal erythema or tonsillar abscesses.  Eyes: Conjunctivae and EOM are normal. Pupils are equal, round, and reactive to light. No scleral icterus.  Neck: Normal range of motion. Neck supple. No tracheal deviation present.  Cardiovascular: Normal rate, regular rhythm, normal heart sounds and intact distal pulses.   No murmur heard. Pulmonary/Chest: Effort normal. No respiratory distress. She has no wheezes. She has no rales.  Decreased breath sounds - may be secondary to body habitus and effort.   Abdominal: Soft.  Musculoskeletal: Normal range of motion.  Lymphadenopathy:    She has no cervical adenopathy.  Neurological: She is alert. Coordination and gait normal.  Skin: Skin is warm and dry. She is not diaphoretic.  Psychiatric: She has a normal mood and affect. Her behavior is normal.  Nursing note and  vitals reviewed.   ED Course  Procedures (including critical care time)  DIAGNOSTIC STUDIES: Oxygen Saturation is 95% on RA, adequate by my interpretation.    COORDINATION OF CARE: 9:06 PM-Discussed treatment plan which includes breathing treatment with pt at bedside and pt agreed to plan.   Labs Review Labs Reviewed - No data to display  Imaging Review No results found. I have personally reviewed and evaluated these images and lab results as part of my medical decision-making.   EKG Interpretation None      MDM   Final diagnoses:  Sinus congestion  Cough   Tammie Williams is a 45 y.o. female with h/o of asthma presents to ED with complaint of sinus congestion and cough x 2 weeks. Patient is afebrile  and non-toxic. BP is mildly elevated, which may be secondary to sudafed ingestion. TTP over right maxillary sinus and frontal sinuses b/l. Mild bulging of TM b/l, no erythema or injection. No trismus. No erythema, swelling of posterior pharynx. No tonsillar swelling or exudate. Uvula midline. Centor score 0. Doubt PTA, ludwig's angina, or strep pharyngitis. Mildly decreased breath sounds; however, unsure if is this effort related or body habitus. No wheezing, rales, or rhonchi. Discussed obtaining chest x-ray with patient to r/o underlying etiology (e.g. Pneumonia, effusion), patient declined at this time secondary to financial hardships. Discussed possibility of payment plan with hospital, patient still politely declined. Will provide breathing treatment in ED.   10:00 PM: On re-evaluation, patient endorses subjective improvement in breathing. No wheezes, rales, or rhonchi. Will send patient home with albuterol inhaler as needed for relief of shortness of breath.   Given duration of symptoms, minimal improvement with OTC treatments, and physical exam findings suspect sinusitis. Will treat with course of ABX. Discussed d/c of off-brand Afrin as can cause rebound congestion. Provided flonase for relief inflammation.   Discussed plan with patient. Provided Rx for amoxicillin-clavulanate with coupon. Received inhaler and Flonase. Can continue OTC cough relief agents. Encouraged follow up with a primary care provider, provided contact information for [facility]. Provided return precautions. Patient voiced understanding and is agreeable.   I personally performed the services described in this documentation, which was scribed in my presence. The recorded information has been reviewed and is accurate.      Lona Kettle, PA-C 02/17/16 1028  Nelva Nay, MD 02/18/16 [phone]

## 2016-03-14 ENCOUNTER — Encounter (HOSPITAL_COMMUNITY): Payer: Self-pay | Admitting: Emergency Medicine

## 2016-03-14 ENCOUNTER — Ambulatory Visit (HOSPITAL_COMMUNITY)
Admission: EM | Admit: 2016-03-14 | Discharge: 2016-03-14 | Disposition: A | Discharge status: Home / Self Care | Payer: 59 | Attending: Emergency Medicine | Admitting: Emergency Medicine

## 2016-03-14 DIAGNOSIS — M62830 Muscle spasm of back: Secondary | ICD-10-CM | POA: Diagnosis not present

## 2016-03-14 LAB — POCT URINALYSIS DIP (DEVICE)
BILIRUBIN URINE: NEGATIVE
GLUCOSE, UA: NEGATIVE mg/dL
Ketones, ur: NEGATIVE mg/dL
LEUKOCYTES UA: NEGATIVE
NITRITE: NEGATIVE
Protein, ur: 100 mg/dL — AB
Specific Gravity, Urine: 1.02 (ref 1.005–1.030)
UROBILINOGEN UA: 0.2 mg/dL (ref 0.0–1.0)
pH: 6 (ref 5.0–8.0)

## 2016-03-14 LAB — POCT PREGNANCY, URINE: PREG TEST UR: NEGATIVE

## 2016-03-14 MED ORDER — CYCLOBENZAPRINE HCL 5 MG PO TABS: 5.0000 mg | ORAL_TABLET | Freq: Three times a day (TID) | ORAL | Status: DC | PRN

## 2016-03-14 NOTE — ED Notes (Signed)
[facility] a [facility] upper medial back pain x 2 weeks. The patient stated that she can feel a "bump" where her back hurts.

## 2016-03-14 NOTE — ED Provider Notes (Signed)
CSN: [id number]     Arrival date & time 03/14/16  1722 History   First MD Initiated Contact with Patient 03/14/16 1909     Chief Complaint  Patient presents with  . Back Pain   (Consider location/radiation/quality/duration/timing/severity/associated sxs/prior Treatment) HPI She is a 45 year old woman here for evaluation of back pain. This started about 2 weeks ago. She reports pain in the bilateral mid back. It is worse with lifting and certain movements. She has tried heat, ibuprofen, and Naprosyn with minimal improvement. She denies any radiating pain. She did notice a scabbed lesion on the right mid back 2 days ago. No additional rash or lesions. She reports a sinus infection about 3 weeks ago that was treated with Augmentin. She states those symptoms have resolved at this time. Coughing does make the pain a little worse. She also started a new job about 6 weeks ago that involves a lot of sitting. She states she has a normal office chair, but without lumbar support. 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  Past Medical History  Diagnosis Date  . Asthma   . Sickle cell trait (HCC)   . Anemia   . Vitamin D deficiency    Past Surgical History  Procedure Laterality Date  . Cesarean section    . Wisdom tooth extraction     Family History  Problem Relation Age of Onset  . Diabetes Mother    Social History  Substance Use Topics  . Smoking status: Never Smoker   . Smokeless tobacco: None  . Alcohol Use: No   OB History    Gravida Para Term Preterm AB TAB SAB Ectopic Multiple Living   4 1 1  3 1 1 1  1      Review of Systems As in history of present illness Allergies  Review of patient's allergies indicates no known allergies.  Home Medications   Prior to Admission medications   Medication Sig Start Date End Date Taking? Authorizing Provider  Ascorbic Acid (VITAMIN C PO) Take 1 tablet by mouth daily.    Yes Historical Provider, MD  cetirizine  (ZYRTEC) 10 MG tablet Take 10 mg by mouth daily.   Yes Historical Provider, MD  VITAMIN D, CHOLECALCIFEROL, PO Take 2 tablets by mouth daily.    Yes Historical Provider, MD  cyclobenzaprine (FLEXERIL) 5 MG tablet Take 1 tablet (5 mg total) by mouth 3 (three) times daily as needed for muscle spasms. 03/14/16   Charm RingsErin J Luvada Salamone, MD  fluticasone (FLONASE) 50 MCG/ACT nasal spray Place 1 spray into both nostrils daily as needed for allergies or rhinitis.    Historical Provider, MD  guaiFENesin (MUCINEX) 600 MG 12 hr tablet Take 600 mg by mouth 2 (two) times daily as needed for to loosen phlegm.    Historical Provider, MD  Multiple Vitamin (MULTIVITAMIN WITH MINERALS) TABS tablet Take 1 tablet by mouth daily.    Historical Provider, MD  Multiple Vitamins-Minerals (ZINC PO) Take 1 tablet by mouth daily.    Historical Provider, MD  Olopatadine HCl (PATADAY OP) Place 1 drop into both eyes daily.    Historical Provider, MD  Pseudoephedrine HCl (SUDAFED 12 HOUR PO) Take 1 tablet by mouth daily as needed. For cold and allergies    Historical Provider, MD  Tetrahydrozoline HCl (VISINE OP) Place 1 drop into both eyes daily.    Historical Provider, MD   Meds Ordered and Administered this Visit  Medications - No data to display  BP 134/69 mmHg  Pulse 75  Temp(Src) 98.7 F (37.1 C) (Oral)  SpO2 100%  LMP 03/14/2016 (Exact Date) No data found.   Physical Exam  Constitutional: She is oriented to person, place, and time. She appears well-developed and well-nourished. No distress.  Cardiovascular: Normal rate.   Pulmonary/Chest: Effort normal and breath sounds normal. No respiratory distress. She has no wheezes. She has no rales.  Musculoskeletal:  Back: No erythema or edema. No vertebral tenderness or step-offs. She has significant spasm in bilateral thoracic paraspinous muscles. These are quite tender to palpation.  Neurological: She is alert and oriented to person, place, and time.  Skin:  She has a cluster  of scabbed lesions just to the right of midline at the bra line. These do not appear to be vesicular.    ED Course  Procedures (including critical care time)  Labs Review Labs Reviewed  POCT URINALYSIS DIP (DEVICE) - Abnormal; Notable for the following:    Hgb urine dipstick LARGE (*)    Protein, ur 100 (*)    All other components within normal limits  POCT PREGNANCY, URINE    Imaging Review No results found.    MDM   1. Muscle spasm of back    I suspect her back pain is coming from the prolonged sitting at her new job. Recommended lumbar support. Conservative management with heat, NSAIDs, Flexeril, and gentle massage. Follow-up as needed.    Charm Rings, MD 03/14/16 1946

## 2016-03-14 NOTE — Discharge Instructions (Signed)
You have a lot of spasm in the muscles of your back. This is likely due to the new job where you are sitting more. The sinus infection probably didn't help. Please get a lumbar support for your chair at work. You can also use a small throw pillow. Apply heat several times a day. Use the Flexeril 3 times a day as needed for pain. This medicine may make you drowsy. It is okay to take ibuprofen or Aleve as needed. Gentle massage with icy hot or BenGay will also be beneficial. Do gentle stretching for the next week. You can go back to the gym once this is improved. Follow-up as needed.

## 2016-05-05 ENCOUNTER — Ambulatory Visit (INDEPENDENT_AMBULATORY_CARE_PROVIDER_SITE_OTHER): Payer: 59 | Admitting: Physician Assistant

## 2016-05-05 VITALS — BP 126/78 | HR 98 | Temp 98.9°F | Resp 14 | Ht 64.0 in | Wt 212.0 lb

## 2016-05-05 DIAGNOSIS — R7303 Prediabetes: Secondary | ICD-10-CM

## 2016-05-05 DIAGNOSIS — Z131 Encounter for screening for diabetes mellitus: Secondary | ICD-10-CM | POA: Diagnosis not present

## 2016-05-05 DIAGNOSIS — M792 Neuralgia and neuritis, unspecified: Secondary | ICD-10-CM | POA: Diagnosis not present

## 2016-05-05 DIAGNOSIS — M542 Cervicalgia: Secondary | ICD-10-CM | POA: Diagnosis not present

## 2016-05-05 LAB — POCT GLYCOSYLATED HEMOGLOBIN (HGB A1C): HEMOGLOBIN A1C: 5.8

## 2016-05-05 MED ORDER — PREDNISONE 20 MG PO TABS: ORAL_TABLET | ORAL | 0 refills | Status: AC

## 2016-05-05 NOTE — Patient Instructions (Signed)
     IF you received an x-ray today, you will receive an invoice from [facility]. Please contact [facility] at [phone] with questions or concerns regarding your invoice.   IF you received labwork today, you will receive an invoice from [facility]. Please contact [facility] at [phone] with questions or concerns regarding your invoice.   Our billing staff will not be able to assist you with questions regarding bills from these companies.  You will be contacted with the lab results as soon as they are available. The fastest way to get your results is to activate your My Chart account. Instructions are located on the last page of this paperwork. If you have not heard from us regarding the results in 2 weeks, please contact this office.      

## 2016-05-05 NOTE — Progress Notes (Signed)
05/05/2016 12:46 PM   DOB: 06-12-1971 / MRN: [id number]  SUBJECTIVE:  Tammie Williams is a 45 y.o. female presenting for upper left back pain and neck pain that started about 6 days ago.    She thinks this back pain is coming from sitting too much at work.  She has tried flexeril and OTC motrin 200 q4-6 with mild relief. She associates some mild radicular pain to the left 5th phalange. Denies bowel and urinary incontinence, weakness, change in dexterity.   She has No Known Allergies.   She  has a past medical history of Anemia; Asthma; Sickle cell trait (HCC); and Vitamin D deficiency.    She  reports that she has never smoked. She does not have any smokeless tobacco history on file. She reports that she does not drink alcohol or use drugs. She  reports that she currently engages in sexual activity. She reports using the following method of birth control/protection: None. The patient  has a past surgical history that includes Cesarean section and Wisdom tooth extraction.  Her family history includes Diabetes in her mother.  Review of Systems  Constitutional: Negative for chills and fever.  Cardiovascular: Negative for chest pain.  Musculoskeletal: Positive for myalgias and neck pain. Negative for falls.  Skin: Negative for itching and rash.  Neurological: Negative for dizziness and headaches.  Psychiatric/Behavioral: Negative for depression.    The problem list and medications were reviewed and updated by myself where necessary and exist elsewhere in the encounter.   OBJECTIVE:  BP 126/78   Pulse 98   Temp 98.9 F (37.2 C) (Oral)   Resp 14   Ht  (1.626 m)   Wt 212 lb (96.2 kg)   SpO2 98%   BMI 36.39 kg/m   Physical Exam  Constitutional: She is oriented to person, place, and time. She appears well-nourished. No distress.  Eyes: EOM are normal. Pupils are equal, round, and reactive to light.  Cardiovascular: Normal rate.   Pulmonary/Chest: Effort normal.  Abdominal: She  exhibits no distension.  Neurological: She is alert and oriented to person, place, and time. She has normal strength and normal reflexes. She displays no atrophy and no tremor. No cranial nerve deficit or sensory deficit. She exhibits normal muscle tone. She displays no seizure activity. Gait normal. GCS eye subscore is 4. GCS verbal subscore is 5. GCS motor subscore is 6.  Mild decrease in sensation about the left 5th digit.   Skin: Skin is dry. She is not diaphoretic.  Psychiatric: She has a normal mood and affect.  Vitals reviewed.   Results for orders placed or performed in visit on 05/05/16 (from the past 72 hour(s))  POCT glycosylated hemoglobin (Hb A1C)     Status: None   Collection Time: 05/05/16 12:20 PM  Result Value Ref Range   Hemoglobin A1C 5.8     No results found.  ASSESSMENT AND PLAN  Odeal was seen today for back pain and neck pain.  Diagnoses and all orders for this visit:  Neck pain: Next step is imaging if she does not have relief with this medication.  -     predniSONE (DELTASONE) 20 MG tablet; Take 3 in the morning for 3 days, then 2 in the morning for 3 days, and then 1 in the morning for 3 days.  Radicular pain in left arm -     predniSONE (DELTASONE) 20 MG tablet; Take 3 in the morning for 3 days, then 2 in the  morning for 3 days, and then 1 in the morning for 3 days.  Screening for diabetes mellitus (DM) -     POCT glycosylated hemoglobin (Hb A1C)  Prediabetes: Advised she tighten up her diet and lose some weight via exercise.  She was very receptive.  Will see her back in a year for recheck.     The patient is advised to call or return to clinic if she does not see an improvement in symptoms, or to seek the care of the closest emergency department if she worsens with the above plan.   Deliah Boston, MHS, PA-C [facility] 05/05/2016 12:46 PM

## 2016-08-13 ENCOUNTER — Other Ambulatory Visit: Payer: Self-pay | Admitting: Internal Medicine

## 2016-08-13 DIAGNOSIS — N6310 Unspecified lump in the right breast, unspecified quadrant: Secondary | ICD-10-CM

## 2016-08-17 ENCOUNTER — Ambulatory Visit
Admission: RE | Admit: 2016-08-17 | Discharge: 2016-08-17 | Disposition: A | Discharge status: Home / Self Care | Payer: 59 | Source: Ambulatory Visit | Attending: Internal Medicine | Admitting: Internal Medicine

## 2016-08-17 ENCOUNTER — Other Ambulatory Visit: Payer: Self-pay | Admitting: Internal Medicine

## 2016-08-17 DIAGNOSIS — N6310 Unspecified lump in the right breast, unspecified quadrant: Secondary | ICD-10-CM

## 2016-08-17 DIAGNOSIS — N6320 Unspecified lump in the left breast, unspecified quadrant: Secondary | ICD-10-CM

## 2016-08-31 ENCOUNTER — Encounter (HOSPITAL_COMMUNITY): Payer: Self-pay | Admitting: *Deleted

## 2016-08-31 ENCOUNTER — Ambulatory Visit (HOSPITAL_COMMUNITY)
Admission: EM | Admit: 2016-08-31 | Discharge: 2016-08-31 | Disposition: A | Discharge status: Home / Self Care | Payer: 59 | Attending: Family Medicine | Admitting: Family Medicine

## 2016-08-31 DIAGNOSIS — R509 Fever, unspecified: Secondary | ICD-10-CM

## 2016-08-31 MED ORDER — IBUPROFEN 800 MG PO TABS: 800.0000 mg | ORAL_TABLET | Freq: Three times a day (TID) | ORAL | 0 refills | Status: DC | PRN

## 2016-08-31 MED ORDER — AMOXICILLIN-POT CLAVULANATE 875-125 MG PO TABS
1.0000 | ORAL_TABLET | Freq: Two times a day (BID) | ORAL | 0 refills | Status: DC
Start: 2016-08-31 — End: 2018-01-01

## 2016-08-31 MED ORDER — FLUCONAZOLE 150 MG PO TABS: 150.0000 mg | ORAL_TABLET | Freq: Every day | ORAL | 0 refills | Status: DC

## 2016-08-31 NOTE — ED Triage Notes (Signed)
Pt  Has   Symptoms  Of   Stuffy  Nose      And  Pain   When  She   Swallows  As  Well   As   Body  Aches    X  2-3  Days

## 2016-08-31 NOTE — Discharge Instructions (Signed)
Your symptoms are likely from the flu or another viral illness. With plenty of fluids, rest, and ibuprofen as prescribed, you should get better within the next 1-2 days. If for some reason her symptoms worsen over the course of the weekend you may start the Augmentin. Consider using the Diflucan if you develop a yeast type symptoms.

## 2016-08-31 NOTE — ED Provider Notes (Signed)
[facility]    CSN: [id number] Arrival date & time: 08/31/16  1452     History   Chief Complaint Chief Complaint  Patient presents with  . Chills    HPI [name] is a 45 y.o. female.   HPI   Myalgias. Started approximately 4 days ago. Patient takes initially due to starting her menstrual cycle. Symptoms have worsened since that time. Subjective fevers and chills started approximately 2 days ago. Intermittent. Ibuprofen 200 mg significant improvement. Patient feels very fatigued with diffuse weakness. Cough and congestion. Sore throat. Sinus pain and pressure started 2 days ago. Patient states that she took her flu shot with work. Over-the-counter Zyrtec and vitamin C with minimal improvement.  Denies nausea, vomiting, abdominal pain, dysuria, back pain, flank pain Stiffness, LOC, dysphagia.  Past Medical History:  Diagnosis Date  . Anemia   . Anemia   . Asthma   . Sickle cell trait (HCC)   . Vitamin D deficiency     There are no active problems to display for this patient.   Past Surgical History:  Procedure Laterality Date  . CESAREAN SECTION    . WISDOM TOOTH EXTRACTION      OB History    Gravida Para Term Preterm AB Living   4 1 1   3 1    SAB TAB Ectopic Multiple Live Births   1 1 1            Home Medications    Prior to Admission medications   Medication Sig Start Date End Date Taking? Authorizing Provider  ibuprofen (ADVIL,MOTRIN) 400 MG tablet 400 mg.   Yes Historical Provider, MD  cetirizine (ZYRTEC) 10 MG tablet Take 10 mg by mouth daily.    Historical Provider, MD  cyclobenzaprine (FLEXERIL) 5 MG tablet Take 1 tablet (5 mg total) by mouth 3 (three) times daily as needed for muscle spasms. 03/14/16   Charm RingsErin J Honig, MD  fluticasone (FLONASE) 50 MCG/ACT nasal spray Place 1 spray into both nostrils daily as needed for allergies or rhinitis.    Historical Provider, MD  Multiple Vitamin (MULTIVITAMIN WITH MINERALS) TABS tablet Take 1  tablet by mouth daily.    Historical Provider, MD  Pseudoephedrine HCl (SUDAFED 12 HOUR PO) Take 1 tablet by mouth daily as needed. For cold and allergies    Historical Provider, MD  Tetrahydrozoline HCl (VISINE OP) Place 1 drop into both eyes daily.    Historical Provider, MD  VITAMIN D, CHOLECALCIFEROL, PO Take 2 tablets by mouth daily.     Historical Provider, MD    Family History Family History  Problem Relation Age of Onset  . Diabetes Mother     Social History Social History  Substance Use Topics  . Smoking status: Never Smoker  . Smokeless tobacco: Never Used  . Alcohol use No     Allergies   Patient has no known allergies.   Review of Systems Review of Systems Per HPI with all other pertinent systems negative.    Physical Exam Triage Vital Signs ED Triage Vitals  Enc Vitals Group     BP 08/31/16 1511 132/70     Pulse Rate 08/31/16 1511 102     Resp 08/31/16 1511 18     Temp 08/31/16 1511 101.4 F (38.6 C)     Temp Source 08/31/16 1511 Oral     SpO2 08/31/16 1511 98 %     Weight --      Height --  Head Circumference --      Peak Flow --      Pain Score 08/31/16 1508 7     Pain Loc --      Pain Edu? --      Excl. in GC? --    No data found.   Updated Vital Signs BP 132/70 (BP Location: Right Arm)   Pulse 102   Temp 101.4 F (38.6 C) (Oral)   Resp 18   LMP 08/27/2016   SpO2 98%   Visual Acuity Right Eye Distance:   Left Eye Distance:   Bilateral Distance:    Right Eye Near:   Left Eye Near:    Bilateral Near:     Physical Exam   Physical Exam  Constitutional: oriented to person, place, and time. appears well-developed and well-nourished. No distress.  HENT:  Head: Normocephalic and atraumatic.  Eyes: EOMI. PERRL.  Neck: Normal range of motion.  Cardiovascular: RRR, no m/r/g, 2+ distal pulses,  Pulmonary/Chest: Effort normal and breath sounds normal. No respiratory distress.  Abdominal: Soft. Bowel sounds are normal. NonTTP, no  distension.  Musculoskeletal: Normal range of motion. Non ttp, no effusion.  Neurological: alert and oriented to person, place, and time.  Skin: Skin is warm. No rash noted. non diaphoretic.  Psychiatric: normal mood and affect. behavior is normal. Judgment and thought content normal.    UC Treatments / Results  Labs (all labs ordered are listed, but only abnormal results are displayed) Labs Reviewed - No data to display  EKG  EKG Interpretation None       Radiology No results found.  Procedures Procedures (including critical care time)  Medications Ordered in UC Medications - No data to display   Initial Impression / Assessment and Plan / UC Course  I have reviewed the triage vital signs and the nursing notes.  Pertinent labs & imaging results that were available during my care of the patient were reviewed by me and considered in my medical decision making (see chart for details).  Clinical Course     Patient symptoms likely secondary to flulike illness. Maxillary sinus pain concerning for beginnings of acute sinusitis. Patient aware that symptoms likely to improve over the next 1-2 days. If symptoms do not improve or become significantly worse especially related to sinusitis symptoms patient is to start the Augmentin. In the interim patient to start using an increased dose of an anti-inflammatory for symptom relief. Diflucan given if patient develops yeast infection after antibiotics.  Final Clinical Impressions(s) / UC Diagnoses   Final diagnoses:  None    New Prescriptions New Prescriptions   No medications on file     Ozella Rocksavid J Merrell, MD 08/31/16 1550

## 2017-05-01 DIAGNOSIS — Z6837 Body mass index (BMI) 37.0-37.9, adult: Secondary | ICD-10-CM | POA: Insufficient documentation

## 2017-05-26 ENCOUNTER — Encounter (HOSPITAL_COMMUNITY): Payer: Self-pay | Admitting: *Deleted

## 2017-05-26 DIAGNOSIS — T2016XA Burn of first degree of forehead and cheek, initial encounter: Secondary | ICD-10-CM | POA: Diagnosis not present

## 2017-05-26 DIAGNOSIS — J45909 Unspecified asthma, uncomplicated: Secondary | ICD-10-CM | POA: Diagnosis not present

## 2017-05-26 DIAGNOSIS — Z79899 Other long term (current) drug therapy: Secondary | ICD-10-CM | POA: Insufficient documentation

## 2017-05-26 DIAGNOSIS — Y93G3 Activity, cooking and baking: Secondary | ICD-10-CM | POA: Diagnosis not present

## 2017-05-26 DIAGNOSIS — Y92000 Kitchen of unspecified non-institutional (private) residence as  the place of occurrence of the external cause: Secondary | ICD-10-CM | POA: Diagnosis not present

## 2017-05-26 DIAGNOSIS — X102XXA Contact with fats and cooking oils, initial encounter: Secondary | ICD-10-CM | POA: Insufficient documentation

## 2017-05-26 DIAGNOSIS — T23121A Burn of first degree of single right finger (nail) except thumb, initial encounter: Secondary | ICD-10-CM | POA: Insufficient documentation

## 2017-05-26 DIAGNOSIS — T300 Burn of unspecified body region, unspecified degree: Secondary | ICD-10-CM | POA: Diagnosis present

## 2017-05-26 DIAGNOSIS — Y998 Other external cause status: Secondary | ICD-10-CM | POA: Diagnosis not present
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
## 2017-05-27 ENCOUNTER — Emergency Department (HOSPITAL_COMMUNITY)
Admission: EM | Admit: 2017-05-27 | Discharge: 2017-05-27 | Disposition: A | Discharge status: Home / Self Care | Payer: 59 | Attending: Emergency Medicine | Admitting: Emergency Medicine

## 2017-05-27 DIAGNOSIS — T300 Burn of unspecified body region, unspecified degree: Secondary | ICD-10-CM

## 2017-05-27 MED ORDER — BACITRACIN ZINC 500 UNIT/GM EX OINT
TOPICAL_OINTMENT | Freq: Two times a day (BID) | CUTANEOUS | Status: DC
  Administered 2017-05-27: 1 via TOPICAL

## 2017-05-27 MED ORDER — MUPIROCIN CALCIUM 2 % EX CREA: 1.0000 "application " | TOPICAL_CREAM | Freq: Two times a day (BID) | CUTANEOUS | 0 refills | Status: DC
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
## 2017-09-13 ENCOUNTER — Other Ambulatory Visit: Payer: Self-pay | Admitting: Internal Medicine

## 2017-09-13 DIAGNOSIS — Z1231 Encounter for screening mammogram for malignant neoplasm of breast: Secondary | ICD-10-CM

## 2017-09-26 ENCOUNTER — Ambulatory Visit: Payer: 59

## 2017-10-07 ENCOUNTER — Emergency Department (HOSPITAL_COMMUNITY)
Admission: EM | Admit: 2017-10-07 | Discharge: 2017-10-07 | Disposition: A | Discharge status: Home / Self Care | Payer: 59 | Attending: Emergency Medicine | Admitting: Emergency Medicine

## 2017-10-07 ENCOUNTER — Emergency Department (HOSPITAL_COMMUNITY): Payer: 59

## 2017-10-07 ENCOUNTER — Other Ambulatory Visit: Payer: Self-pay

## 2017-10-07 ENCOUNTER — Encounter (HOSPITAL_COMMUNITY): Payer: Self-pay

## 2017-10-07 DIAGNOSIS — J45909 Unspecified asthma, uncomplicated: Secondary | ICD-10-CM | POA: Insufficient documentation

## 2017-10-07 DIAGNOSIS — Y9389 Activity, other specified: Secondary | ICD-10-CM | POA: Diagnosis not present

## 2017-10-07 DIAGNOSIS — Y92481 Parking lot as the place of occurrence of the external cause: Secondary | ICD-10-CM | POA: Diagnosis not present

## 2017-10-07 DIAGNOSIS — R072 Precordial pain: Secondary | ICD-10-CM | POA: Insufficient documentation

## 2017-10-07 DIAGNOSIS — Y999 Unspecified external cause status: Secondary | ICD-10-CM | POA: Insufficient documentation

## 2017-10-07 DIAGNOSIS — R079 Chest pain, unspecified: Secondary | ICD-10-CM | POA: Diagnosis present

## 2017-10-07 HISTORY — DX: Anxiety disorder, unspecified: F41.9

## 2017-10-07 MED ORDER — IBUPROFEN 800 MG PO TABS: 800.0000 mg | ORAL_TABLET | Freq: Three times a day (TID) | ORAL | 0 refills | Status: DC | PRN
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
## 2017-10-07 NOTE — Discharge Instructions (Signed)
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
## 2017-11-25 ENCOUNTER — Telehealth: Payer: Self-pay | Admitting: Cardiovascular Disease

## 2017-11-25 NOTE — Telephone Encounter (Signed)
Received incoming records from [facility] for upcoming appointment on 01/01/18 @ 2pm with Dr. Duke Salviaandolph. Records located in Medical Records. 11/25/17 ab

## 2018-01-01 ENCOUNTER — Ambulatory Visit (INDEPENDENT_AMBULATORY_CARE_PROVIDER_SITE_OTHER): Payer: 59 | Admitting: Cardiovascular Disease

## 2018-01-01 ENCOUNTER — Encounter: Payer: Self-pay | Admitting: Cardiovascular Disease

## 2018-01-01 VITALS — BP 114/72 | HR 65 | Ht 64.0 in | Wt 214.0 lb

## 2018-01-01 DIAGNOSIS — R0681 Apnea, not elsewhere classified: Secondary | ICD-10-CM | POA: Diagnosis not present

## 2018-01-01 DIAGNOSIS — R55 Syncope and collapse: Secondary | ICD-10-CM | POA: Diagnosis not present

## 2018-01-01 DIAGNOSIS — R002 Palpitations: Secondary | ICD-10-CM | POA: Diagnosis not present

## 2018-01-01 DIAGNOSIS — K219 Gastro-esophageal reflux disease without esophagitis: Secondary | ICD-10-CM

## 2018-01-01 DIAGNOSIS — R0683 Snoring: Secondary | ICD-10-CM | POA: Diagnosis not present

## 2018-01-01 HISTORY — DX: Syncope and collapse: R55

## 2018-01-01 HISTORY — DX: Palpitations: R00.2

## 2018-01-01 HISTORY — DX: Gastro-esophageal reflux disease without esophagitis: K21.9
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
## 2018-01-01 NOTE — Patient Instructions (Addendum)
Medication Instructions:  Your physician recommends that you continue on your current medications as directed. Please refer to the Current Medication list given to you today.  Labwork: NONE  Testing/Procedures: Your physician has requested that you have an echocardiogram. Echocardiography is a painless test that uses sound waves to create images of your heart. It provides your doctor with information about the size and shape of your heart and how well your heart's chambers and valves are working. This procedure takes approximately one hour. There are no restrictions for this procedure. [facility] AT [address]  Your physician has recommended that you wear an event monitor. Event monitors are medical devices that record the heart's electrical activity. Doctors most often [place] these monitors to diagnose arrhythmias. Arrhythmias are problems with the speed or rhythm of the heartbeat. The monitor is a small, portable device. You can wear one while you do your normal daily activities. This is usually used to diagnose what is causing palpitations/syncope (passing out). 7 DAY  [facility] AT [address]  Your physician has recommended that you have a sleep study. This test records several body functions during sleep, including: brain activity, eye movement, oxygen and carbon dioxide blood levels, heart rate and rhythm, breathing rate and rhythm, the flow of air through your mouth and nose, snoring, body muscle movements, and chest and belly movement. THE OFFICE WILL CONTACT YOU AFTER YOUR INSURANCE HAS APPROVED   Follow-Up: Your physician recommends that you schedule a follow-up appointment in: 3 MONTH OV   If you need a refill on your cardiac medications before your next appointment, please call your pharmacy.  Echocardiogram An echocardiogram, or echocardiography, uses sound waves (ultrasound) to produce an image of your heart. The echocardiogram is simple, painless,  obtained within a short period of time, and offers valuable information to your health care provider. The images from an echocardiogram can provide information such as:  Evidence of coronary artery disease (CAD).  Heart size.  Heart muscle function.  Heart valve function.  Aneurysm detection.  Evidence of a past heart attack.  Fluid buildup around the heart.  Heart muscle thickening.  Assess heart valve function.  Tell a health care provider about:  Any allergies you have.  All medicines you are taking, including vitamins, herbs, eye drops, creams, and over-the-counter medicines.  Any problems you or family members have had with anesthetic medicines.  Any blood disorders you have.  Any surgeries you have had.  Any medical conditions you have.  Whether you are pregnant or may be pregnant. What happens before the procedure? No special preparation is needed. Eat and drink normally. What happens during the procedure?  In order to produce an image of your heart, gel will be applied to your chest and a wand-like tool (transducer) will be moved over your chest. The gel will help transmit the sound waves from the transducer. The sound waves will harmlessly bounce off your heart to allow the heart images to be captured in real-time motion. These images will then be recorded.  You may need an IV to receive a medicine that improves the quality of the pictures. What happens after the procedure? You may return to your normal schedule including diet, activities, and medicines, unless your health care provider tells you otherwise. This information is not intended to replace advice given to you by your health care provider. Make sure you discuss any questions you have with your health care provider. Document  Released: 09/14/2000 Document Revised: 05/05/2016 Document Reviewed: 05/25/2013 Elsevier Interactive Patient Education  2017 Elsevier Inc.   Cardiac Event Monitoring A cardiac  event monitor is a small recording device that is used to detect abnormal heart rhythms (arrhythmias). The monitor is used to record your heart rhythm when you have symptoms, such as:  Fast heartbeats (palpitations), such as heart racing or fluttering.  Dizziness.  Fainting or light-headedness.  Unexplained weakness.  Some monitors are wired to electrodes placed on your chest. Electrodes are flat, sticky disks that attach to your skin. Other monitors may be hand-held or worn on the wrist. The monitor can be worn for up to 30 days. If the monitor is attached to your chest, a technician will prepare your chest for the electrode placement and show you how to work the monitor. Take time to practice using the monitor before you leave the office. Make sure you understand how to send the information from the monitor to your health care provider. In some cases, you may need to use a landline telephone instead of a cell phone. What are the risks? Generally, this device is safe to use, but it possible that the skin under the electrodes will become irritated. How to use your cardiac event monitor  Wear your monitor at all times, except when you are in water: ? Do not let the monitor get wet. ? Take the monitor off when you bathe. Do not swim or use a hot tub with it on.  Keep your skin clean. Do not put body lotion or moisturizer on your chest.  Change the electrodes as told by your health care provider or any time they stop sticking to your skin. You may need to use medical tape to keep them on.  Try to put the electrodes in slightly different places on your chest to help prevent skin irritation. They must remain in the area under your left breast and in the upper right section of your chest.  Make sure the monitor is safely clipped to your clothing or in a location close to your body that your health care provider recommends.  Press the button to record as soon as you feel heart-related symptoms,  such as: ? Dizziness. ? Weakness. ? Light-headedness. ? Palpitations. ? Thumping or pounding in your chest. ? Shortness of breath. ? Unexplained weakness.  Keep a diary of your activities, such as walking, doing chores, and taking medicine. It is very important to note what you were doing when you pushed the button to record your symptoms. This will help your health care provider determine what might be contributing to your symptoms.  Send the recorded information as recommended by your health care provider. It may take some time for your health care provider to process the results.  Change the batteries as told by your health care provider.  Keep electronic devices away from your monitor. This includes: ? Tablets. ? MP3 players. ? Cell phones.  While wearing your monitor you should avoid: ? Electric blankets. ? Firefighter. ? Electric toothbrushes. ? Microwave ovens. ? Magnets. ? Metal detectors. Get help right away if:  You have chest pain.  You have extreme difficulty breathing or shortness of breath.  You develop a very fast heartbeat that persists.  You develop dizziness that does not go away.  You faint or constantly feel like you are about to faint. Summary  A cardiac event monitor is a small recording device that is used to help detect abnormal heart  rhythms (arrhythmias).  The monitor is used to record your heart rhythm when you have heart-related symptoms.  Make sure you understand how to send the information from the monitor to your health care provider.  It is important to press the button on the monitor when you have any heart-related symptoms.  Keep a diary of your activities, such as walking, doing chores, and taking medicine. It is very important to note what you were doing when you pushed the button to record your symptoms. This will help your health care provider learn what might be causing your symptoms. This information is not intended to  replace advice given to you by your health care provider. Make sure you discuss any questions you have with your health care provider. Document Released: 06/26/2008 Document Revised: 09/01/2016 Document Reviewed: 09/01/2016 Elsevier Interactive Patient Education  2017 Elsevier Inc.  Sleep Studies A sleep study (polysomnogram) is a series of tests done while you are sleeping. It can show how well you sleep. This can help your health care provider diagnose a sleep disorder and show how severe your sleep disorder is. A sleep study may lead to treatment that will help you sleep better and prevent other medical problems caused by poor sleep. If you have a sleep disorder, you may also be at risk for:  Sleep-related accidents.  High blood pressure.  Heart disease.  Stroke.  Other medical conditions.  Sleep disorders are common. Your health care provider may suspect a sleep disorder if you:  Have loud snoring most nights.  Have brief periods when you stop breathing at night.  Feel sleepy on most days.  Fall asleep suddenly during the day.  Have trouble falling asleep or staying asleep.  Feel like you need to move your legs when trying to fall asleep.  Have dreams that seem very real shortly after falling asleep.  Feel like you cannot move when you first wake up.  Which tests will I need to have? Most sleep studies last all night and include these tests:  Recordings of your brain activity.  Recordings of your eye movements.  Recording of your heart rate and rhythm.  Blood pressure readings.  Readings of the amount of oxygen in your blood.  Measurements of your chest and belly movement as you breathe during sleep.  If you have signs of the sleep disorder called sleep apnea during your test, you may get a mask to wear for the second half of the night.  The mask provides continuous positive airway pressure (CPAP). This may improve sleep apnea significantly.  You will then  have all tests done again with the mask in place to see if your measurements and recordings change.  How are sleep studies done? Most sleep studies are done over one full night of sleep.  You will arrive at the study center in the evening and can go home in the morning.  Bring your pajamas and toothbrush.  Do not have caffeine on the day of your sleep study.  Your health care provider will let you know if you need to stop taking any of your regular medicines before the test.  To do the tests included in a polysomnogram, you will have:  Round, sticky patches with sensors attached to recording wires (electrodes) placed on your scalp, face, chest, and limbs.  Wires from all the electrodes and sensors run from your bed to a computer. The wires can be taken off and put back on if you need to get out  of bed to go to the bathroom.  A sensor placed over your nose to measure airflow.  A finger clip put on one finger to measure your blood oxygen level.  A belt around your belly and a belt around your chest to measure breathing movements.  Where are sleep studies done? Sleep studies are done at sleep centers. A sleep center may be inside a hospital, office, or clinic. The room where you have the study may look like a hospital room or a hotel room. The health care providers doing the study may come in and out of the room during the study. Most of the time, they will be in another room monitoring your test. How is information from sleep studies helpful? A polysomnogram can be used along with your medical history and a physical exam to diagnose conditions, such as:  Sleep apnea.  Restless legs syndrome.  Sleep-related seizure disorders.  Sleep-related movement disorders.  A medical doctor who specializes in sleep will evaluate your sleep study. The specialist will share the results with your primary health care provider. Treatments based on your sleep study may include:  Improving your  sleep habits (sleep hygiene).  Wearing a CPAP mask.  Wearing an oral device at night to improve breathing and reduce snoring.  Taking medicine for: ? Restless legs syndrome. ? Sleep-related seizure disorder. ? Sleep-related movement disorder.  This information is not intended to replace advice given to you by your health care provider. Make sure you discuss any questions you have with your health care provider. Document Released: 03/24/2003 Document Revised: 05/13/2016 Document Reviewed: 11/23/2013 Elsevier Interactive Patient Education  [employer]2018 Elsevier Inc.

## 2018-01-01 NOTE — Progress Notes (Signed)
error 

## 2018-01-07 ENCOUNTER — Telehealth: Payer: Self-pay | Admitting: *Deleted

## 2018-01-08 NOTE — Telephone Encounter (Signed)
Patient returned a call and was notified of her HST appointment.

## 2018-01-13 ENCOUNTER — Telehealth: Payer: Self-pay | Admitting: Cardiovascular Disease

## 2018-01-13 NOTE — Telephone Encounter (Signed)
Returned call to patient. She wanted to know if she would have to wear her monitor all day. Explained she would wear event monitor for 7 days, all day long except for when she is bathing. She voiced understanding. Informed her that her [facility] monitor & echo [facility]

## 2018-01-13 NOTE — Telephone Encounter (Signed)
New message  Pt verbalzied that she is calling for RN  To go over her options for how long she has to wear her Holter monitor

## 2018-01-16 ENCOUNTER — Ambulatory Visit (HOSPITAL_COMMUNITY): Payer: 59 | Attending: Cardiology

## 2018-01-16 ENCOUNTER — Ambulatory Visit (INDEPENDENT_AMBULATORY_CARE_PROVIDER_SITE_OTHER): Payer: 59

## 2018-01-16 ENCOUNTER — Other Ambulatory Visit: Payer: Self-pay

## 2018-01-16 DIAGNOSIS — Z6836 Body mass index (BMI) 36.0-36.9, adult: Secondary | ICD-10-CM | POA: Insufficient documentation

## 2018-01-16 DIAGNOSIS — R55 Syncope and collapse: Secondary | ICD-10-CM | POA: Diagnosis not present

## 2018-01-16 DIAGNOSIS — R002 Palpitations: Secondary | ICD-10-CM

## 2018-01-16 DIAGNOSIS — R0789 Other chest pain: Secondary | ICD-10-CM | POA: Insufficient documentation

## 2018-01-16 DIAGNOSIS — E669 Obesity, unspecified: Secondary | ICD-10-CM | POA: Insufficient documentation

## 2018-01-16 DIAGNOSIS — R0681 Apnea, not elsewhere classified: Secondary | ICD-10-CM

## 2018-01-16 DIAGNOSIS — R0683 Snoring: Secondary | ICD-10-CM

## 2018-01-29 ENCOUNTER — Ambulatory Visit (HOSPITAL_BASED_OUTPATIENT_CLINIC_OR_DEPARTMENT_OTHER): Payer: 59 | Attending: Cardiovascular Disease | Admitting: Cardiovascular Disease

## 2018-01-29 DIAGNOSIS — G4733 Obstructive sleep apnea (adult) (pediatric): Secondary | ICD-10-CM | POA: Diagnosis not present

## 2018-02-05 ENCOUNTER — Other Ambulatory Visit (HOSPITAL_BASED_OUTPATIENT_CLINIC_OR_DEPARTMENT_OTHER): Payer: Self-pay

## 2018-02-05 DIAGNOSIS — R0683 Snoring: Secondary | ICD-10-CM

## 2018-02-05 DIAGNOSIS — R002 Palpitations: Secondary | ICD-10-CM

## 2018-02-05 DIAGNOSIS — R55 Syncope and collapse: Secondary | ICD-10-CM

## 2018-02-05 DIAGNOSIS — R0681 Apnea, not elsewhere classified: Secondary | ICD-10-CM

## 2018-02-20 ENCOUNTER — Encounter (HOSPITAL_BASED_OUTPATIENT_CLINIC_OR_DEPARTMENT_OTHER): Payer: Self-pay | Admitting: Cardiovascular Disease

## 2018-02-20 NOTE — Procedures (Signed)
    Patient Name: Tammie Williams, Tammie Williams Date: 01/29/2018 Gender: Female D.O.B: 16-Jun-1971 Age (years): 46 Referring Provider: Chilton Si Height (inches): 65 Interpreting Physician: Nicki Guadalajara MD, ABSM Weight (lbs): 210 RPSGT: Elaina Pattee BMI: 35 MRN: [id number] Neck Size: 14.50  CLINICAL INFORMATION Sleep Study Type: HST  Indication for sleep study: OSA, Snoring  Epworth Sleepiness Score: 10  SLEEP STUDY TECHNIQUE A multi-channel overnight portable sleep study was performed. The channels recorded were: nasal airflow, thoracic respiratory movement, and oxygen saturation with a pulse oximetry. Snoring was also monitored.  MEDICATIONS     Ascorbic Acid (VITAMIN C) 100 MG tablet         cetirizine (ZYRTEC) 10 MG tablet         diclofenac (VOLTAREN) 75 MG EC tablet         fluticasone (FLONASE) 50 MCG/ACT nasal spray         ibuprofen (ADVIL,MOTRIN) 800 MG tablet         Multiple Vitamin (MULTIVITAMIN WITH MINERALS) TABS tablet         Tetrahydrozoline HCl (VISINE OP)         VITAMIN D, CHOLECALCIFEROL, PO      Patient self administered medications include: N/A.  SLEEP ARCHITECTURE Patient was studied for 541.3 minutes. The sleep efficiency was 86.8 % and the patient was supine for 34.3%. The arousal index was 0.0 per hour.  RESPIRATORY PARAMETER The overall AHI was 9.5 per hour, with a central apnea index of 0.0 per hour.  The oxygen nadir was 62% during sleep.  CARDIAC DATA Mean heart rate during sleep was 72.7 bpm.  IMPRESSIONS - Mild obstructive sleep apnea  (AHI 9.5/h). There is a positional component witjh supine AHI 14.9/h versus non-supine AHI 6.75/h. - No significant central sleep apnea occurred during this study (CAI = 0.0/h). - Severe oxygen desaturation to a nadir of O2%.  - Patient snored 11.7% during the sleep.  DIAGNOSIS - Obstructive Sleep Apnea (327.23 [G47.33 ICD-10]) - Nocturnal Hypoxemia (327.26 [G47.36  ICD-10])  RECOMMENDATIONS - Recommend therapeutic CPAP titration to determine optimal pressure required to alleviate sleep disordered breathing. - Efforts should be made to optimize nasal and oropharyngeal patency. - Avoid alcohol, sedatives and other CNS depressants that may worsen sleep apnea and disrupt normal sleep architecture. - The patient shouod be counseled to avoid supine sleep. - Sleep hygiene should be reviewed to assess factors that may improve sleep quality. - Weight management and regular exercise should be initiated or continued. - Recommend an in-lab study with the significant oxygen desaturation.  [Electronically signed] 02/20/2018 03:48 PM  Nicki Guadalajara MD, [facility], ABSM Diplomate, American Board of Sleep Medicine   NPI: [id number] [facility] PH: [phone]   FX: [phone] ACCREDITED BY THE AMERICAN ACADEMY OF SLEEP MEDICINE

## 2018-03-05 ENCOUNTER — Telehealth: Payer: Self-pay | Admitting: Cardiovascular Disease

## 2018-03-05 NOTE — Telephone Encounter (Signed)
New Message:       Pt is returning a call from May

## 2018-03-05 NOTE — Telephone Encounter (Signed)
-----   Message from Chilton Siiffany Rosedale, MD sent at 01/31/2018  2:10 PM EDT ----- No arrhythmias

## 2018-03-05 NOTE — Telephone Encounter (Signed)
Advised patient of results.  

## 2018-04-06 ENCOUNTER — Encounter (HOSPITAL_COMMUNITY): Payer: Self-pay | Admitting: Emergency Medicine

## 2018-04-06 ENCOUNTER — Ambulatory Visit (HOSPITAL_COMMUNITY)
Admission: EM | Admit: 2018-04-06 | Discharge: 2018-04-06 | Disposition: A | Discharge status: Home / Self Care | Payer: 59 | Attending: Family Medicine | Admitting: Family Medicine

## 2018-04-06 ENCOUNTER — Other Ambulatory Visit: Payer: Self-pay

## 2018-04-06 DIAGNOSIS — G43109 Migraine with aura, not intractable, without status migrainosus: Secondary | ICD-10-CM

## 2018-04-06 DIAGNOSIS — R0789 Other chest pain: Secondary | ICD-10-CM

## 2018-04-06 DIAGNOSIS — R002 Palpitations: Secondary | ICD-10-CM | POA: Diagnosis not present

## 2018-04-06 DIAGNOSIS — R42 Dizziness and giddiness: Secondary | ICD-10-CM | POA: Diagnosis not present

## 2018-04-06 MED ORDER — DEXAMETHASONE SODIUM PHOSPHATE 10 MG/ML IJ SOLN
INTRAMUSCULAR | Status: AC
  Filled 2018-04-06: qty 1

## 2018-04-06 MED ORDER — ONDANSETRON HCL 4 MG PO TABS: 4.0000 mg | ORAL_TABLET | Freq: Four times a day (QID) | ORAL | 0 refills | Status: DC

## 2018-04-06 MED ORDER — DEXAMETHASONE SODIUM PHOSPHATE 10 MG/ML IJ SOLN
10.0000 mg | Freq: Once | INTRAMUSCULAR | Status: AC
  Administered 2018-04-06: 10 mg via INTRAMUSCULAR

## 2018-04-06 MED ORDER — KETOROLAC TROMETHAMINE 60 MG/2ML IM SOLN
INTRAMUSCULAR | Status: AC
  Filled 2018-04-06: qty 2

## 2018-04-06 MED ORDER — BUTALBITAL-APAP-CAFFEINE 50-325-40 MG PO TABS: 1.0000 | ORAL_TABLET | Freq: Four times a day (QID) | ORAL | 0 refills | Status: AC | PRN

## 2018-04-06 MED ORDER — KETOROLAC TROMETHAMINE 60 MG/2ML IM SOLN
60.0000 mg | Freq: Once | INTRAMUSCULAR | Status: AC
  Administered 2018-04-06: 60 mg via INTRAMUSCULAR

## 2018-04-06 NOTE — ED Provider Notes (Signed)
[facility]    CSN: [id number] Arrival date & time: 04/06/18  1600     History   Chief Complaint Chief Complaint  Patient presents with  . Anxiety    HPI [name] is a 47 y.o. female.   HPI  Patient is here with her husband to be evaluated for chest pain.  She also vaguely feels dizzy.  Some visual disturbance.  Headache.  She states it feels like a typical migraine for her.  She took ibuprofen and tried to lay down.  She was too uncomfortable and restless to sleep.  She has been more anxious lately but does not think that this is an anxiety attack.  She has had chest pain from her anxiety previously.  She has been evaluated by cardiology.  Her chest pain is not exertional, it is in her left chest, and feels like a "squeezing" pressure.  No diaphoresis.  No nausea.  No shortness of breath or palpitations.  She states that headache is worse in the right side of her head.  She has photophobia.  She has no medicine at home to take for migraines.  She is not on medicine for anxiety.  Past Medical History:  Diagnosis Date  . Anemia   . Anemia   . Anxiety   . Asthma   . GERD (gastroesophageal reflux disease) 01/01/2018  . Near syncope 01/01/2018  . Palpitations 01/01/2018  . Sickle cell trait (HCC)   . Vitamin D deficiency     Patient Active Problem List   Diagnosis Date Noted  . GERD (gastroesophageal reflux disease) 01/01/2018  . Palpitations 01/01/2018  . Near syncope 01/01/2018  . GAD (generalized anxiety disorder) 04/24/2011    Past Surgical History:  Procedure Laterality Date  . CESAREAN SECTION    . WISDOM TOOTH EXTRACTION      OB History    Gravida  4   Para  1   Term  1   Preterm      AB  3   Living  1     SAB  1   TAB  1   Ectopic  1   Multiple      Live Births               Home Medications    Prior to Admission medications   Medication Sig Start Date End Date Taking? Authorizing Provider  cetirizine (ZYRTEC) 10 MG  tablet Take 10 mg by mouth daily.   Yes [provider]  diclofenac (VOLTAREN) 75 MG EC tablet Take 1 tablet by mouth 2 (two) times daily. 12/28/17  Yes [provider]  ibuprofen (ADVIL,MOTRIN) 800 MG tablet Take 1 tablet (800 mg total) by mouth every 8 (eight) hours as needed. 10/07/17  Yes Long, Arlyss RepressJoshua G, MD  butalbital-acetaminophen-caffeine (FIORICET, ESGIC) [phone] MG tablet Take 1-2 tablets by mouth every 6 (six) hours as needed for headache. 04/06/18 04/06/19  Eustace MooreNelson, Conita Amenta Sue, MD  ondansetron (ZOFRAN) 4 MG tablet Take 1 tablet (4 mg total) by mouth every 6 (six) hours. 04/06/18   Eustace MooreNelson, Gil Ingwersen Sue, MD    Family History Family History  Problem Relation Age of Onset  . CAD Sister   . Stroke Maternal Grandmother     Social History Social History   Tobacco Use  . Smoking status: Never Smoker  . Smokeless tobacco: Never Used  Substance Use Topics  . Alcohol use: No  . Drug use: No     Allergies  Patient has no known allergies.   Review of Systems Review of Systems  Constitutional: Negative for chills, fatigue and fever.  HENT: Negative for ear pain and sore throat.   Eyes: Positive for photophobia. Negative for pain and visual disturbance.  Respiratory: Negative for cough and shortness of breath.   Cardiovascular: Positive for chest pain. Negative for palpitations.  Gastrointestinal: Negative for abdominal pain, nausea and vomiting.  Genitourinary: Negative for dysuria and hematuria.  Musculoskeletal: Negative for arthralgias and back pain.  Skin: Negative for color change and rash.  Neurological: Positive for dizziness and headaches. Negative for seizures and syncope.  All other systems reviewed and are negative.    Physical Exam Triage Vital Signs ED Triage Vitals [04/06/18 1625]  Enc Vitals Group     BP (!) 144/74     Pulse Rate 72     Resp 18     Temp 98.1 F (36.7 C)     Temp Source Oral     SpO2 100 %     Weight      Height      Head  Circumference      Peak Flow      Pain Score 8     Pain Loc      Pain Edu?      Excl. in GC?    No data found.  Updated Vital Signs BP (!) 144/74 (BP Location: Left Arm)   Pulse 72   Temp 98.1 F (36.7 C) (Oral)   Resp 18   SpO2 100%       Physical Exam  Constitutional: She is oriented to person, place, and time. She appears well-developed and well-nourished. No distress.  HENT:  Head: Normocephalic and atraumatic.  Right Ear: External ear normal.  Left Ear: External ear normal.  Mouth/Throat: Oropharynx is clear and moist.  Eyes: Pupils are equal, round, and reactive to light. Conjunctivae and EOM are normal.  Disks are flat  Neck: Normal range of motion.  Cardiovascular: Normal rate, regular rhythm and normal heart sounds.  Pulmonary/Chest: Effort normal and breath sounds normal. No respiratory distress. She has no wheezes. She has no rales.  Abdominal: Soft. Bowel sounds are normal. She exhibits no distension. There is no tenderness.  Musculoskeletal: Normal range of motion. She exhibits no edema.  Neurological: She is alert and oriented to person, place, and time. She displays normal reflexes. No cranial nerve deficit. Coordination normal.  Skin: Skin is warm and dry.  Psychiatric: She has a normal mood and affect. Her behavior is normal.     UC Treatments / Results  Labs (all labs ordered are listed, but only abnormal results are displayed) Labs Reviewed - No data to display  EKG None  Radiology No results found.  Procedures Procedures (including critical care time)  Medications Ordered in UC Medications  ketorolac (TORADOL) injection 60 mg (60 mg Intramuscular Given 04/06/18 1711)  dexamethasone (DECADRON) injection 10 mg (10 mg Intramuscular Given 04/06/18 1711)    Initial Impression / Assessment and Plan / UC Course  I have reviewed the triage vital signs and the nursing notes.  Pertinent labs & imaging results that were available during my care of  the patient were reviewed by me and considered in my medical decision making (see chart for details).     Final Clinical Impressions(s) / UC Diagnoses   Final diagnoses:  Migraine with aura and without status migrainosus, not intractable  Palpitations     Discharge Instructions  Take headache medication Fioricet as needed When you take this medicine lie down and rest Zofran as prescribed for any nausea Follow-up with your primary care doctor    ED Prescriptions    Medication Sig Dispense Auth. Provider   ondansetron (ZOFRAN) 4 MG tablet Take 1 tablet (4 mg total) by mouth every 6 (six) hours. 12 tablet Eustace Moore, MD   butalbital-acetaminophen-caffeine (FIORICET, ESGIC) [phone] MG tablet Take 1-2 tablets by mouth every 6 (six) hours as needed for headache. 20 tablet Eustace Moore, MD     Controlled Substance Prescriptions [place] Controlled Substance Registry consulted? Yes, I have consulted the [place] Controlled Substances Registry for this patient, and feel the risk/benefit ratio today is favorable for proceeding with this prescription for a controlled substance.   Eustace Moore, MD 04/08/18 [phone]

## 2018-04-06 NOTE — Discharge Instructions (Signed)
Take headache medication Fioricet as needed When you take this medicine lie down and rest Zofran as prescribed for any nausea Follow-up with your primary care doctor

## 2018-04-06 NOTE — ED Triage Notes (Signed)
[facility] a [facility] of dizziness and chest pressure x 2 days. The patient reported a hx of anxiety.

## 2018-04-15 ENCOUNTER — Ambulatory Visit: Payer: 59 | Admitting: Cardiovascular Disease

## 2018-05-28 ENCOUNTER — Ambulatory Visit: Payer: 59 | Admitting: Cardiovascular Disease

## 2018-05-29 ENCOUNTER — Encounter: Payer: Self-pay | Admitting: *Deleted

## 2018-06-04 ENCOUNTER — Telehealth: Payer: Self-pay | Admitting: *Deleted

## 2018-06-04 NOTE — Telephone Encounter (Signed)
PA submitted via web portal and paper fax to [facility] for CPAP titration.

## 2018-06-04 NOTE — Telephone Encounter (Signed)
Left message to return a call to me. 

## 2018-06-04 NOTE — Telephone Encounter (Signed)
-----   Message from Gaynelle Cage, CMA sent at 05/30/2018 11:38 AM EDT ----- CPAP titration

## 2018-06-09 ENCOUNTER — Other Ambulatory Visit: Payer: Self-pay | Admitting: Cardiovascular Disease

## 2018-06-09 DIAGNOSIS — G4733 Obstructive sleep apnea (adult) (pediatric): Secondary | ICD-10-CM

## 2018-06-10 ENCOUNTER — Telehealth: Payer: Self-pay | Admitting: *Deleted

## 2018-06-10 NOTE — Telephone Encounter (Signed)
APAP referral given to [facility].

## 2018-06-10 NOTE — Telephone Encounter (Signed)
Left message to return a call to me to discuss HST results. 

## 2018-06-19 ENCOUNTER — Telehealth: Payer: Self-pay | Admitting: *Deleted

## 2018-06-19 NOTE — Telephone Encounter (Signed)
Received a call from the patient stating she is returning a call, and apologizing for not calling back sooner. HST results and recommendations were discussed with the patient. She tells me that she already has appointment with [facility] next week to be set up for APAP.  Patient voiced understanding of what was told to her. All questions and concerns appeared to be answered to her satisfaction.

## 2018-08-07 ENCOUNTER — Other Ambulatory Visit: Payer: Self-pay | Admitting: Obstetrics and Gynecology

## 2018-08-07 ENCOUNTER — Other Ambulatory Visit (HOSPITAL_COMMUNITY)
Admission: RE | Admit: 2018-08-07 | Discharge: 2018-08-07 | Disposition: A | Discharge status: Home / Self Care | Payer: 59 | Source: Ambulatory Visit | Attending: Obstetrics and Gynecology | Admitting: Obstetrics and Gynecology

## 2018-08-07 DIAGNOSIS — Z01411 Encounter for gynecological examination (general) (routine) with abnormal findings: Secondary | ICD-10-CM | POA: Diagnosis present

## 2018-08-08 ENCOUNTER — Other Ambulatory Visit: Payer: Self-pay | Admitting: Obstetrics and Gynecology

## 2018-08-08 DIAGNOSIS — N6310 Unspecified lump in the right breast, unspecified quadrant: Secondary | ICD-10-CM

## 2018-08-11 LAB — CYTOLOGY - PAP
ADEQUACY: ABSENT
Chlamydia: NEGATIVE
DIAGNOSIS: NEGATIVE
HPV (WINDOPATH): NOT DETECTED
Neisseria Gonorrhea: NEGATIVE

## 2018-08-13 ENCOUNTER — Ambulatory Visit
Admission: RE | Admit: 2018-08-13 | Discharge: 2018-08-13 | Disposition: A | Discharge status: Home / Self Care | Payer: 59 | Source: Ambulatory Visit | Attending: Obstetrics and Gynecology | Admitting: Obstetrics and Gynecology

## 2018-08-13 ENCOUNTER — Other Ambulatory Visit: Payer: Self-pay | Admitting: Obstetrics and Gynecology

## 2018-08-13 ENCOUNTER — Other Ambulatory Visit: Payer: 59

## 2018-08-13 DIAGNOSIS — N6310 Unspecified lump in the right breast, unspecified quadrant: Secondary | ICD-10-CM

## 2018-08-18 ENCOUNTER — Other Ambulatory Visit: Payer: 59

## 2018-08-21 ENCOUNTER — Ambulatory Visit: Payer: Self-pay | Admitting: Internal Medicine

## 2018-08-22 ENCOUNTER — Ambulatory Visit
Admission: RE | Admit: 2018-08-22 | Discharge: 2018-08-22 | Disposition: A | Discharge status: Home / Self Care | Payer: 59 | Source: Ambulatory Visit | Attending: Obstetrics and Gynecology | Admitting: Obstetrics and Gynecology

## 2018-08-22 DIAGNOSIS — N6310 Unspecified lump in the right breast, unspecified quadrant: Secondary | ICD-10-CM

## 2018-09-10 ENCOUNTER — Ambulatory Visit: Payer: Self-pay | Admitting: Internal Medicine

## 2018-10-11 ENCOUNTER — Encounter: Payer: Self-pay | Admitting: Internal Medicine

## 2018-10-11 ENCOUNTER — Ambulatory Visit (INDEPENDENT_AMBULATORY_CARE_PROVIDER_SITE_OTHER): Payer: 59 | Admitting: Internal Medicine

## 2018-10-11 VITALS — BP 110/66 | HR 78 | Temp 97.9°F | Ht 63.5 in | Wt 217.2 lb

## 2018-10-11 DIAGNOSIS — E66812 Obesity, class 2: Secondary | ICD-10-CM

## 2018-10-11 DIAGNOSIS — Z9989 Dependence on other enabling machines and devices: Secondary | ICD-10-CM

## 2018-10-11 DIAGNOSIS — J329 Chronic sinusitis, unspecified: Secondary | ICD-10-CM | POA: Diagnosis not present

## 2018-10-11 DIAGNOSIS — Z0000 Encounter for general adult medical examination without abnormal findings: Secondary | ICD-10-CM

## 2018-10-11 DIAGNOSIS — G4733 Obstructive sleep apnea (adult) (pediatric): Secondary | ICD-10-CM

## 2018-10-11 DIAGNOSIS — E6609 Other obesity due to excess calories: Secondary | ICD-10-CM

## 2018-10-11 DIAGNOSIS — Z09 Encounter for follow-up examination after completed treatment for conditions other than malignant neoplasm: Secondary | ICD-10-CM

## 2018-10-11 DIAGNOSIS — Z6837 Body mass index (BMI) 37.0-37.9, adult: Secondary | ICD-10-CM

## 2018-10-11 LAB — POCT URINALYSIS DIPSTICK
Bilirubin, UA: NEGATIVE
GLUCOSE UA: NEGATIVE
Ketones, UA: NEGATIVE
LEUKOCYTES UA: NEGATIVE
Nitrite, UA: NEGATIVE
Protein, UA: POSITIVE — AB
Spec Grav, UA: 1.02 (ref 1.010–1.025)
Urobilinogen, UA: 0.2 E.U./dL
pH, UA: 6 (ref 5.0–8.0)

## 2018-10-11 NOTE — Patient Instructions (Signed)

## 2018-10-13 LAB — CMP14+EGFR
ALBUMIN: 4.1 g/dL (ref 3.5–5.5)
ALK PHOS: 121 IU/L — AB (ref 39–117)
ALT: 11 IU/L (ref 0–32)
AST: 17 IU/L (ref 0–40)
Albumin/Globulin Ratio: 1.5 (ref 1.2–2.2)
BILIRUBIN TOTAL: 0.3 mg/dL (ref 0.0–1.2)
BUN/Creatinine Ratio: 13 (ref 9–23)
BUN: 9 mg/dL (ref 6–24)
CHLORIDE: 104 mmol/L (ref 96–106)
CO2: 22 mmol/L (ref 20–29)
CREATININE: 0.67 mg/dL (ref 0.57–1.00)
Calcium: 9.1 mg/dL (ref 8.7–10.2)
GFR calc Af Amer: 123 mL/min/{1.73_m2} (ref >59)
GFR calc non Af Amer: 106 mL/min/{1.73_m2} (ref >59)
GLUCOSE: 87 mg/dL (ref 65–99)
Globulin, Total: 2.8 g/dL (ref 1.5–4.5)
Potassium: 4.3 mmol/L (ref 3.5–5.2)
Sodium: 140 mmol/L (ref 134–144)
TOTAL PROTEIN: 6.9 g/dL (ref 6.0–8.5)

## 2018-10-13 LAB — HEMOGLOBIN A1C
Est. average glucose Bld gHb Est-mCnc: 117 mg/dL
HEMOGLOBIN A1C: 5.7 % — AB (ref 4.8–5.6)

## 2018-10-13 LAB — CBC
HEMATOCRIT: 31.5 % — AB (ref 34.0–46.6)
HEMOGLOBIN: 10 g/dL — AB (ref 11.1–15.9)
MCH: 24.1 pg — ABNORMAL LOW (ref 26.6–33.0)
MCHC: 31.7 g/dL (ref 31.5–35.7)
MCV: 76 fL — AB (ref 79–97)
Platelets: 414 10*3/uL (ref 150–450)
RBC: 4.15 x10E6/uL (ref 3.77–5.28)
RDW: 15.3 % (ref 11.7–15.4)
WBC: 8.2 10*3/uL (ref 3.4–10.8)

## 2018-10-13 LAB — LIPID PANEL
CHOL/HDL RATIO: 4.3 ratio (ref 0.0–4.4)
Cholesterol, Total: 229 mg/dL — ABNORMAL HIGH (ref 100–199)
HDL: 53 mg/dL (ref >39)
LDL CALC: 139 mg/dL — AB (ref 0–99)
TRIGLYCERIDES: 186 mg/dL — AB (ref 0–149)
VLDL CHOLESTEROL CAL: 37 mg/dL (ref 5–40)

## 2018-10-13 LAB — TSH: TSH: 1 u[IU]/mL (ref 0.450–4.500)

## 2018-10-13 LAB — HIV ANTIBODY (ROUTINE TESTING W REFLEX): HIV SCREEN 4TH GENERATION: NONREACTIVE

## 2018-10-13 NOTE — Progress Notes (Signed)
I certainly hope you are feeling better! Your urine did have some protein in it. I would like to recheck this in 3 months. Your liver and kidney fxn are nl. Your hemoglobin is low. Iron levels are pending. Do you have heavy cycles? Your LDL, bad chol is 139. Ideally, this should be less than 100. Please increase daily activity and cut back on  Your intake of fried foods. Additionally, your triglycerides are elevated, which implies that you are eating too many processed foods and not getting enough exercise. Your hba1c is 5.7, this is in predm range. Normal is 5.6, you are almost there!  Your thyroid fxn is nl. You are hiv negative.

## 2018-10-14 NOTE — Progress Notes (Signed)
Subjective:     Patient ID: Tammie Williams , female    DOB: Feb 23, 1971 , 48 y.o.   MRN: [id number]   Chief Complaint  Patient presents with  . Annual Exam    HPI  She is here today for a full physical examination. She is now followed by GYN for her pelvic exams. She has no specific concerns or complaints at this time. She does report that she is being treated for recurrent sinus infection. She was seen at Urgent Care.     Past Medical History:  Diagnosis Date  . Anemia   . Anemia   . Anxiety   . Asthma   . GERD (gastroesophageal reflux disease) 01/01/2018  . Near syncope 01/01/2018  . Palpitations 01/01/2018  . Sickle cell trait ([place])   . Vitamin D deficiency      Family History  Problem Relation Age of Onset  . Healthy Mother   . Healthy Father   . CAD Sister   . Stroke Maternal Grandmother      Current Outpatient Medications:  .  amoxicillin (AMOXIL) 500 MG capsule, Take 500 mg by mouth 4 (four) times daily., Disp: , Rfl:  .  butalbital-acetaminophen-caffeine (FIORICET, ESGIC) 50-325-40 MG tablet, Take 1-2 tablets by mouth every 6 (six) hours as needed for headache., Disp: 20 tablet, Rfl: 0 .  cetirizine (ZYRTEC) 10 MG tablet, Take 10 mg by mouth daily., Disp: , Rfl:  .  citalopram (CELEXA) 10 MG tablet, Take 10 mg by mouth daily., Disp: , Rfl:  .  diclofenac (VOLTAREN) 75 MG EC tablet, Take 1 tablet by mouth 2 (two) times daily., Disp: , Rfl: 3 .  ibuprofen (ADVIL,MOTRIN) 800 MG tablet, Take 1 tablet (800 mg total) by mouth every 8 (eight) hours as needed., Disp: 21 tablet, Rfl: 0   No Known Allergies    Last LMP was Patient's last menstrual period was 10/11/2018.. . Negative for: breast discharge, breast lump(s), breast pain and breast self exam. Associated symptoms include abnormal vaginal bleeding. Pertinent negatives include abnormal bleeding (hematology), anxiety, decreased libido, depression, difficulty falling sleep, dyspareunia, history of infertility, nocturia,  sexual dysfunction, sleep disturbances, urinary incontinence, urinary urgency, vaginal discharge and vaginal itching. Diet regular.The patient states her exercise level is  minimal.   . The patient's tobacco use is:  Social History   Tobacco Use  Smoking Status Never Smoker  Smokeless Tobacco Never Used  . She has been exposed to passive smoke. The patient's alcohol use is:  Social History   Substance and Sexual Activity  Alcohol Use No    Review of Systems  Constitutional: Positive for fatigue.  HENT: Positive for congestion, sinus pressure and sinus pain.   Eyes: Negative.   Respiratory: Negative.   Cardiovascular: Negative.   Gastrointestinal: Negative.   Endocrine: Negative.   Genitourinary: Negative.   Musculoskeletal: Negative.   Skin: Negative.   Allergic/Immunologic: Negative.   Neurological: Negative.   Hematological: Negative.   Psychiatric/Behavioral: Negative.      Today's Vitals   10/11/18 0935  BP: 110/66  Pulse: 78  Temp: 97.9 F (36.6 C)  TempSrc: Oral  Weight: 217 lb 3.2 oz (98.5 kg)  Height: 5' 3.5" (1.613 m)   Body mass index is 37.87 kg/m.   Objective:  Physical Exam Vitals signs and nursing note reviewed.  Constitutional:      Appearance: Normal appearance. She is obese. She is ill-appearing.  HENT:     Head: Normocephalic and atraumatic.  Comments: Frontal sinus tenderness to palpation    Right Ear: Tympanic membrane, ear canal and external ear normal.     Left Ear: Tympanic membrane, ear canal and external ear normal.     Nose: Nose normal.     Mouth/Throat:     Mouth: Mucous membranes are moist.     Pharynx: Oropharynx is clear. Posterior oropharyngeal erythema present.  Eyes:     Extraocular Movements: Extraocular movements intact.     Conjunctiva/sclera: Conjunctivae normal.     Pupils: Pupils are equal, round, and reactive to light.  Neck:     Musculoskeletal: Normal range of motion and neck supple.  Cardiovascular:      Rate and Rhythm: Normal rate and regular rhythm.     Heart sounds: Normal heart sounds.  Pulmonary:     Effort: Pulmonary effort is normal.     Breath sounds: Normal breath sounds.  Chest:     Breasts:        Right: Normal. No swelling, bleeding, inverted nipple, mass or nipple discharge.        Left: Normal. No swelling, bleeding, inverted nipple, mass or nipple discharge.  Abdominal:     General: Bowel sounds are normal.     Palpations: Abdomen is soft.     Tenderness: There is no abdominal tenderness.  Genitourinary:    Comments: deferred Musculoskeletal: Normal range of motion.  Skin:    General: Skin is warm and dry.     Capillary Refill: Capillary refill takes less than 2 seconds.  Neurological:     General: No focal deficit present.     Mental Status: She is alert and oriented to person, place, and time.  Psychiatric:        Mood and Affect: Mood normal.         Assessment And Plan:     1. Routine general medical examination at health care facility  A full exam was performed.  Importance of monthly self breast exams was discussed with the patient. PATIENT HAS BEEN ADVISED TO GET 30-45 MINUTES REGULAR EXERCISE NO LESS THAN FOUR TO FIVE DAYS PER WEEK - BOTH WEIGHTBEARING EXERCISES AND AEROBIC ARE RECOMMENDED.  SHE IS ADVISED TO FOLLOW A HEALTHY DIET WITH AT LEAST SIX FRUITS/VEGGIES PER DAY, DECREASE INTAKE OF RED MEAT, AND TO INCREASE FISH INTAKE TO TWO DAYS PER WEEK.  MEATS/FISH SHOULD NOT BE FRIED, BAKED OR BROILED IS PREFERABLE.  I SUGGEST WEARING SPF 50 SUNSCREEN ON EXPOSED PARTS AND ESPECIALLY WHEN IN THE DIRECT SUNLIGHT FOR AN EXTENDED PERIOD OF TIME.  PLEASE AVOID FAST FOOD RESTAURANTS AND INCREASE YOUR WATER INTAKE.  - POCT Urinalysis Dipstick (81002) - CMP14+EGFR - CBC - Lipid panel - Hemoglobin A1c - TSH - HIV antibody (with reflex)  2. Class 2 obesity due to excess calories without serious comorbidity with body mass index (BMI) of 37.0 to 37.9 in adult  She  is encouraged to strive for BMI less than 30 to decrease cardiac risk. She is advised to exercise 30 minutes five days weekly and to avoid sugary beverages.   3. OSA on CPAP  Chronic. Importance of compliance with CPAP was discussed with the patient. She is encouraged to wear nightly for at least 4 hours.   4. Chronic sinusitis, unspecified location  She has requested ENT evaluation due to recurrence of symptoms. She is encouraged to avoid dairy products during her acute symptoms. She is also encouraged to stay well hydrated. She is encouraged to complete full abx course.  Maximino Greenland, MD

## 2018-10-16 LAB — IRON AND TIBC
IRON SATURATION: 7 % — AB (ref 15–55)
IRON: 29 ug/dL (ref 27–159)
Total Iron Binding Capacity: 403 ug/dL (ref 250–450)
UIBC: 374 ug/dL (ref 131–425)

## 2018-10-16 LAB — FERRITIN: FERRITIN: 11 ng/mL — AB (ref 15–150)

## 2018-10-17 ENCOUNTER — Telehealth: Payer: Self-pay

## 2018-10-17 NOTE — Telephone Encounter (Signed)
-----   Message from Dorothyann Peng, MD sent at 10/13/2018  5:42 PM EST ----- I certainly hope you are feeling better! Your urine did have some protein in it. I would like to recheck this in 3 months. Your liver and kidney fxn are nl. Your hemoglobin is low. Iron levels are pending. Do you have heavy cycles? Your LDL, bad chol is 139. Ideally, this should be less than 100. Please increase daily activity and cut back on  Your intake of fried foods. Additionally, your triglycerides are elevated, which implies that you are eating too many processed foods and not getting enough exercise. Your hba1c is 5.7, this is in predm range. Normal is 5.6, you are almost there!  Your thyroid fxn is nl. You are hiv negative.

## 2018-10-17 NOTE — Telephone Encounter (Signed)
Left the pt a message to call back for her lab results. 

## 2018-10-30 ENCOUNTER — Telehealth: Payer: Self-pay

## 2018-10-30 NOTE — Telephone Encounter (Signed)
Pt notified that her flma forms has been faxed.

## 2018-10-31 ENCOUNTER — Other Ambulatory Visit: Payer: Self-pay

## 2018-10-31 MED ORDER — FUSION 65-65-25-30 MG PO CAPS: 1.0000 | ORAL_CAPSULE | Freq: Every day | ORAL | 5 refills | Status: DC

## 2018-11-05 ENCOUNTER — Ambulatory Visit: Payer: Self-pay | Admitting: Internal Medicine

## 2018-12-22 IMAGING — US ULTRASOUND RIGHT BREAST LIMITED
1 series · 8 of 8 positions shown · non-contrast
Comparison: Previous exam(s).

CLINICAL DATA: 47-year-old female with a right breast palpable
abnormality with associated tenderness.

EXAM:
DIGITAL DIAGNOSTIC BILATERAL MAMMOGRAM WITH CAD AND TOMO
RIGHT BREAST ULTRASOUND

[Series 1: ultrasound right breast limited · 0.07mm/px · 8 of 8 slices shown]
[im 1/8]
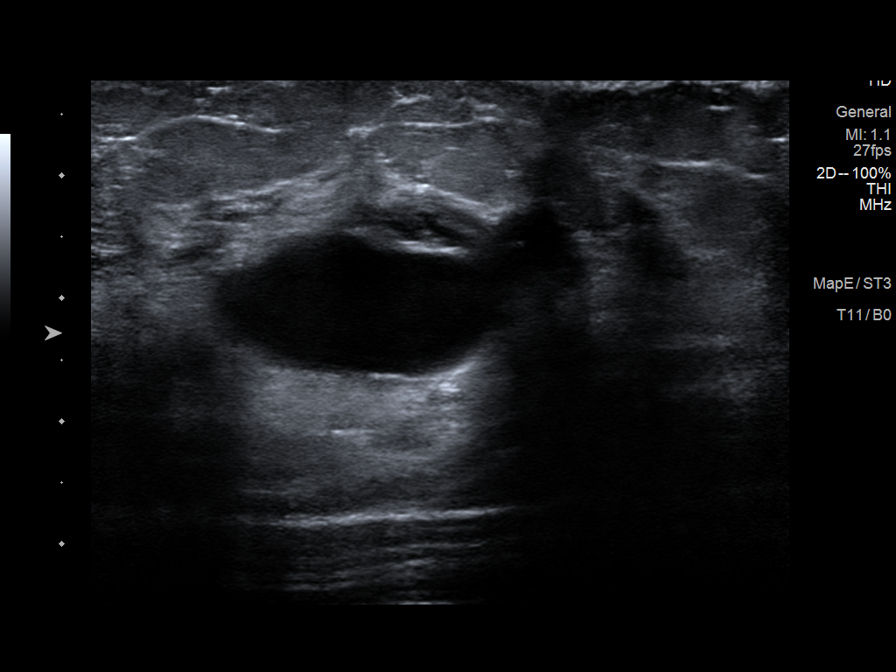
[im 2/8]
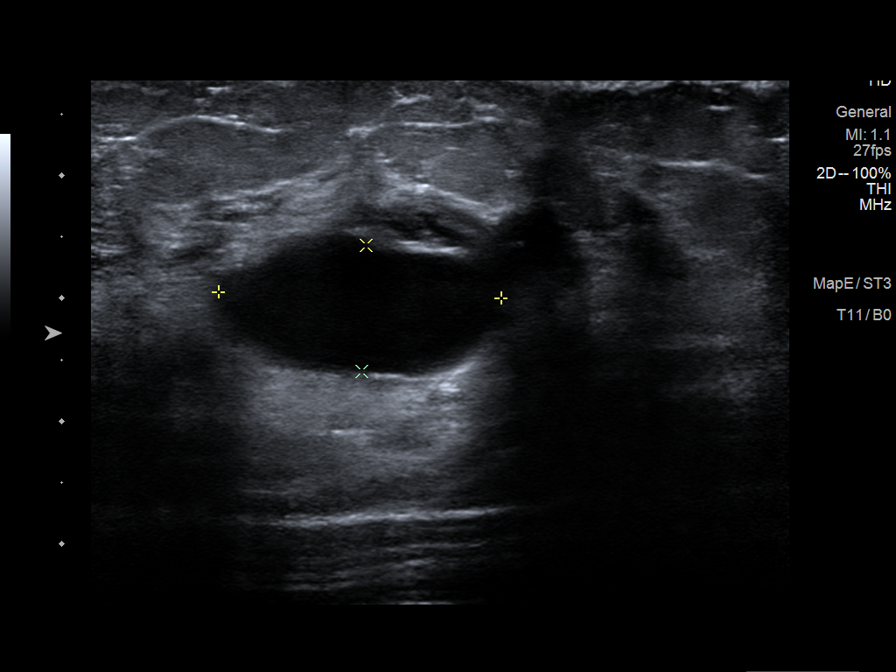
[im 3/8]
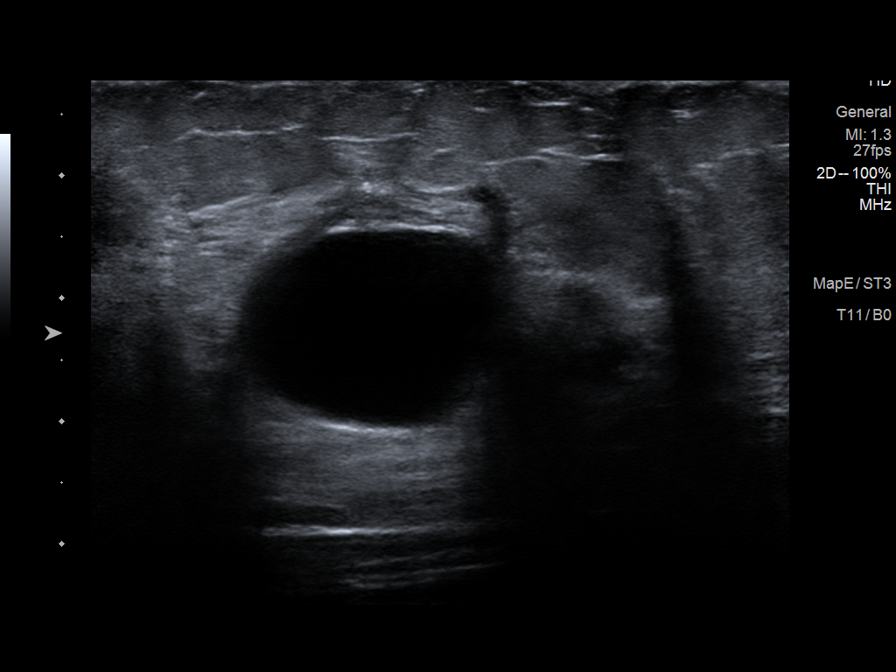
[im 4/8]
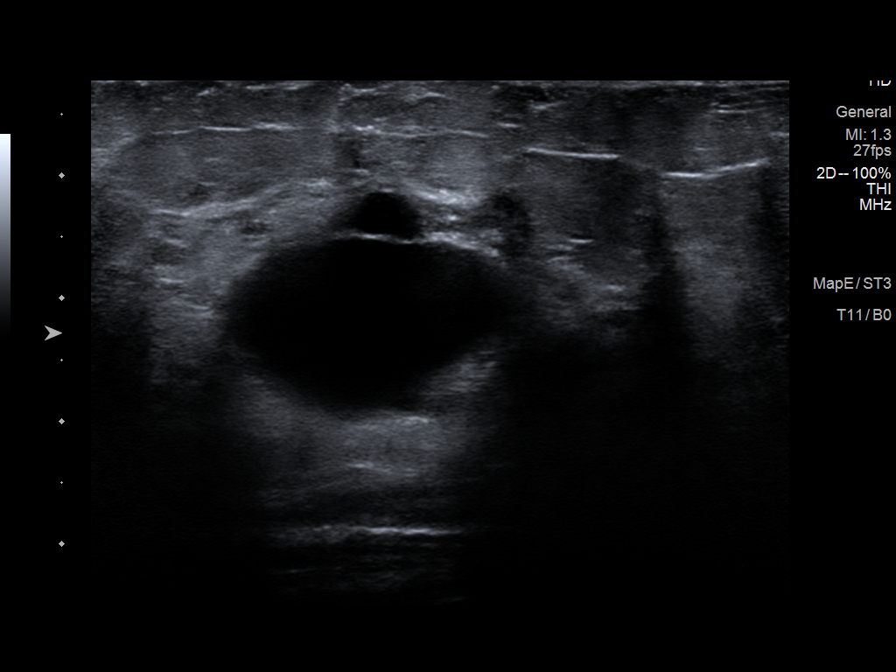
[im 5/8]
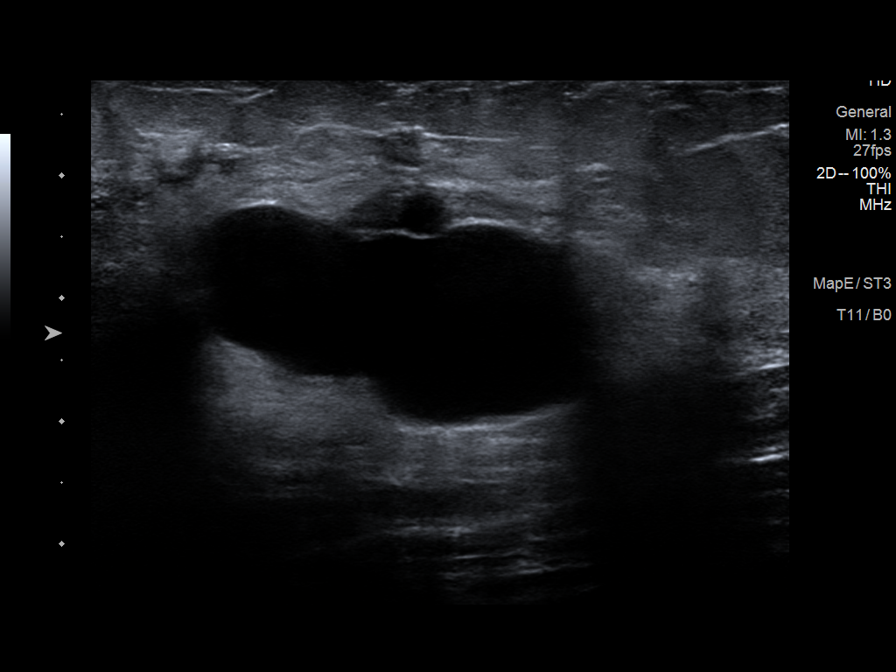
[im 6/8]
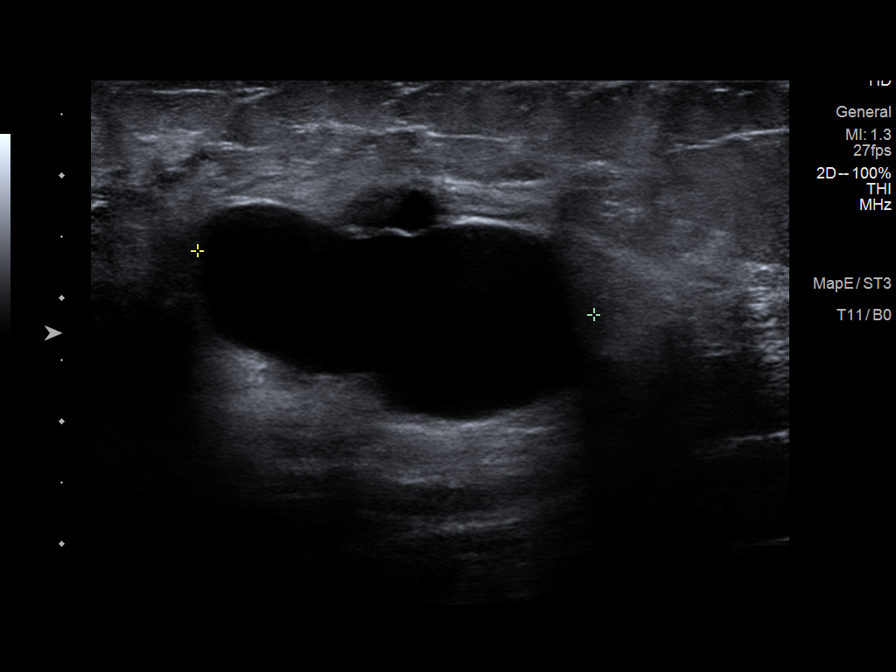
[im 7/8]
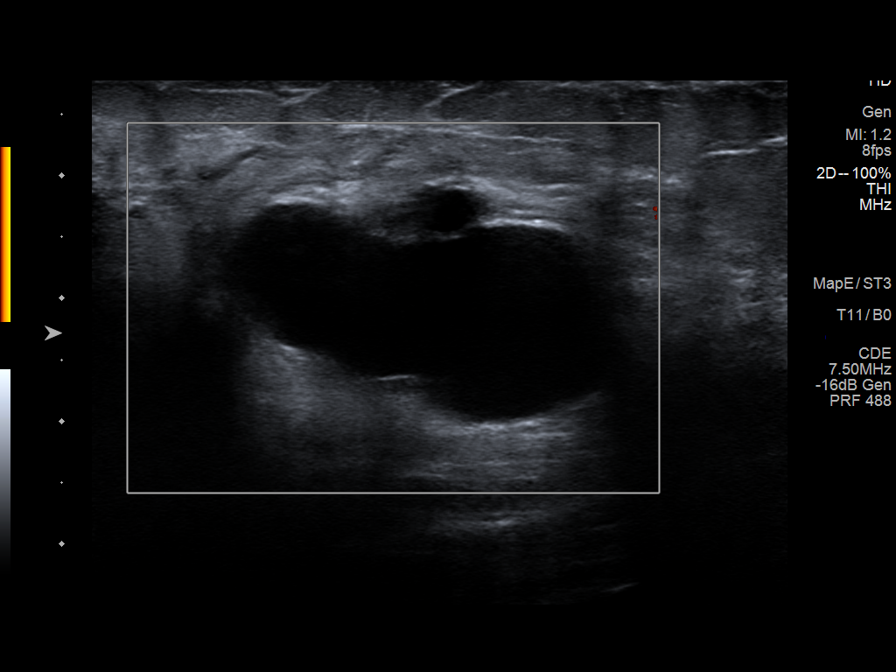
[im 8/8]
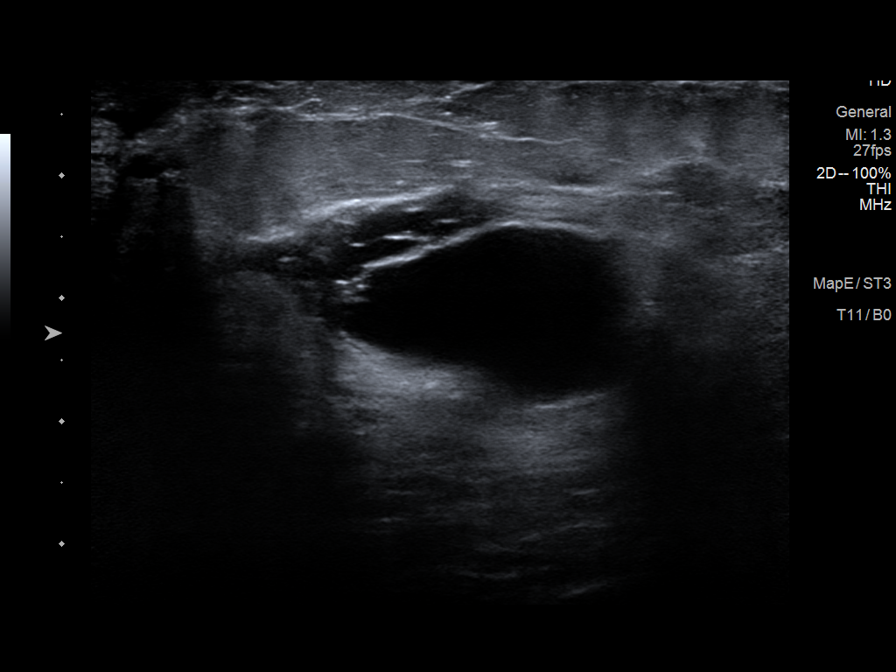

[8 of 8 positions shown; findings below may reference images not displayed]

ACR Breast Density Category c: The breast tissue is heterogeneously
dense, which may obscure small masses.
FINDINGS: There is an oval circumscribed mass in the inner right breast
measuring approximately 3.5 cm. No suspicious masses or
calcifications are seen in the left breast.

Mammographic images were processed with CAD.

Physical examination reveals a firm mobile slightly tender mass in
the upper inner quadrant of the right breast.

Targeted ultrasound of the right breast was performed demonstrating
a large cyst with an adjacent cluster of cysts at 2 o'clock 3 cm
from nipple, with the larger cyst measuring 2.3 x 1 x 3.3 cm. This
corresponds well with the mass seen in the right breast at
mammography.
IMPRESSION: 1. Right breast cyst at site of palpable concern with associated
tenderness.

2.  No findings of malignancy in either breast.

RECOMMENDATION:
1.  Screening mammogram in one year.(Code:FW-3-JTS)

2. Cyst aspiration could be performed if the patient experiences
continued discomfort at the site of the right breast cyst. She will
call the [REDACTED] to schedule if she decides on cyst
aspiration.

I have discussed the findings and recommendations with the patient.
Results were also provided in writing at the conclusion of the
visit. If applicable, a reminder letter will be sent to the patient
regarding the next appointment.

BI-RADS CATEGORY  2: Benign.

## 2018-12-22 IMAGING — MG DIGITAL DIAGNOSTIC BILATERAL MAMMOGRAM WITH TOMO AND CAD
6 of 10 series · 6 of 30 positions shown · non-contrast
Comparison: Previous exam(s).

CLINICAL DATA: 47-year-old female with a right breast palpable
abnormality with associated tenderness.

EXAM:
DIGITAL DIAGNOSTIC BILATERAL MAMMOGRAM WITH CAD AND TOMO
RIGHT BREAST ULTRASOUND

[R MLO synth-2D]
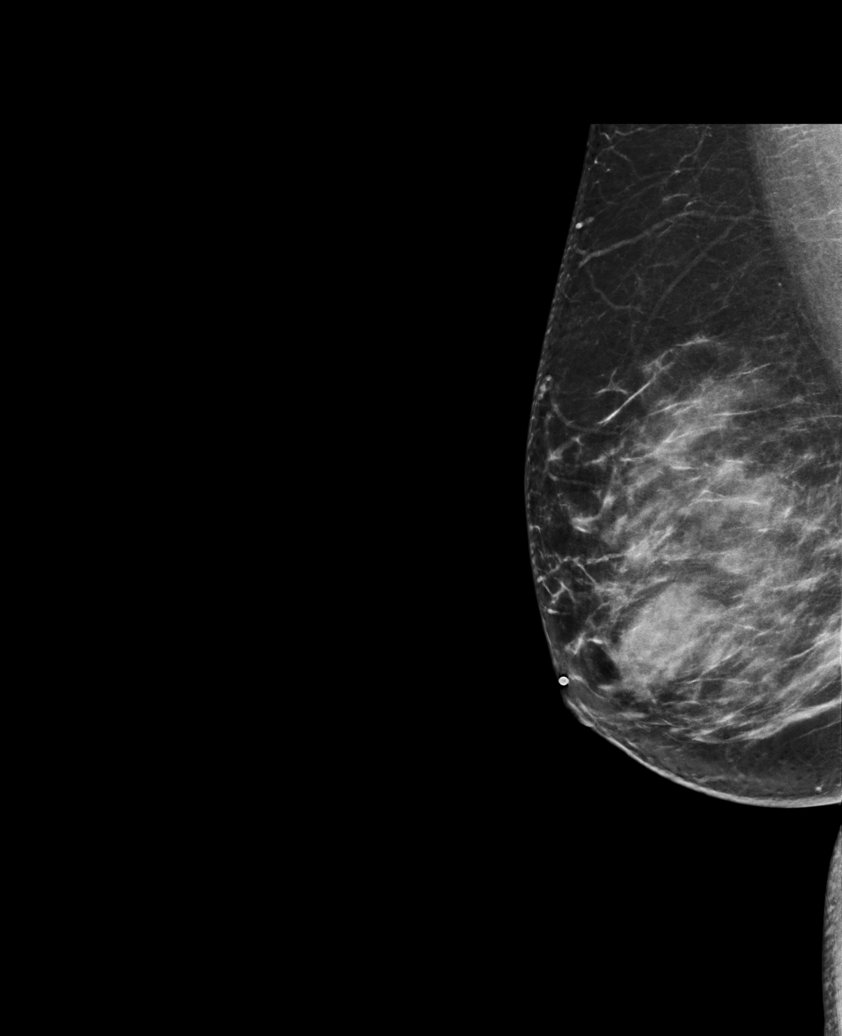

[L MLO synth-2D]
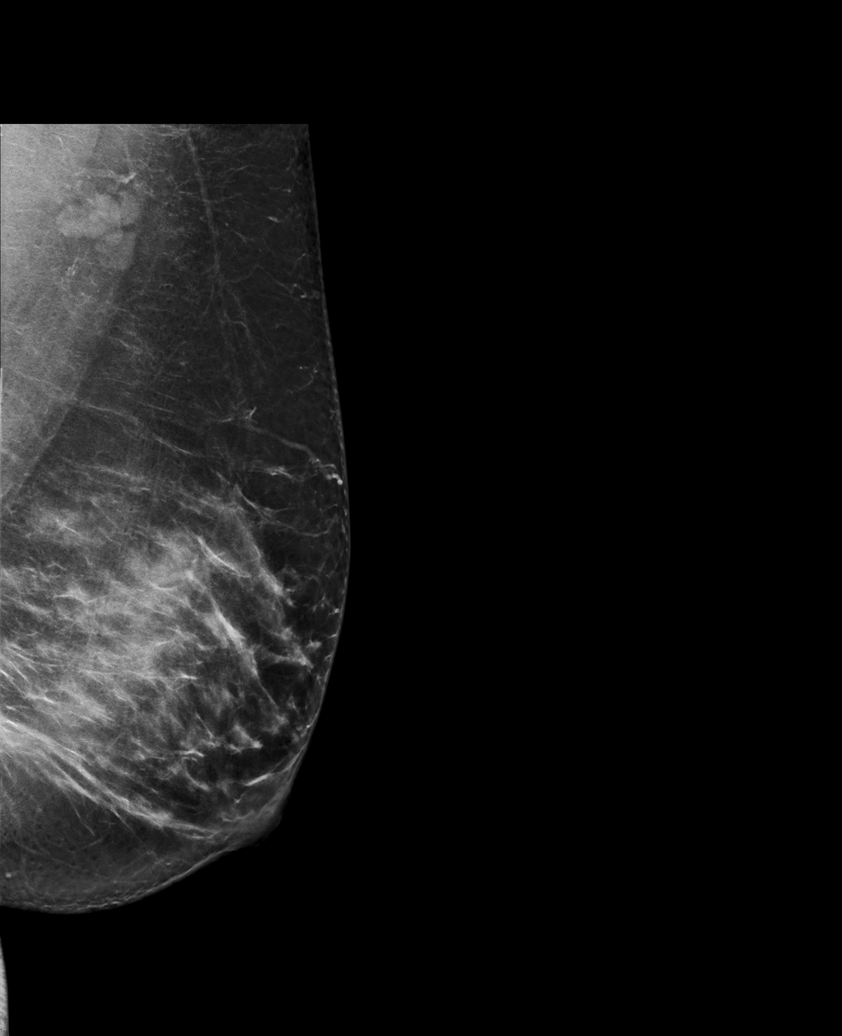

[R CC synth-2D]
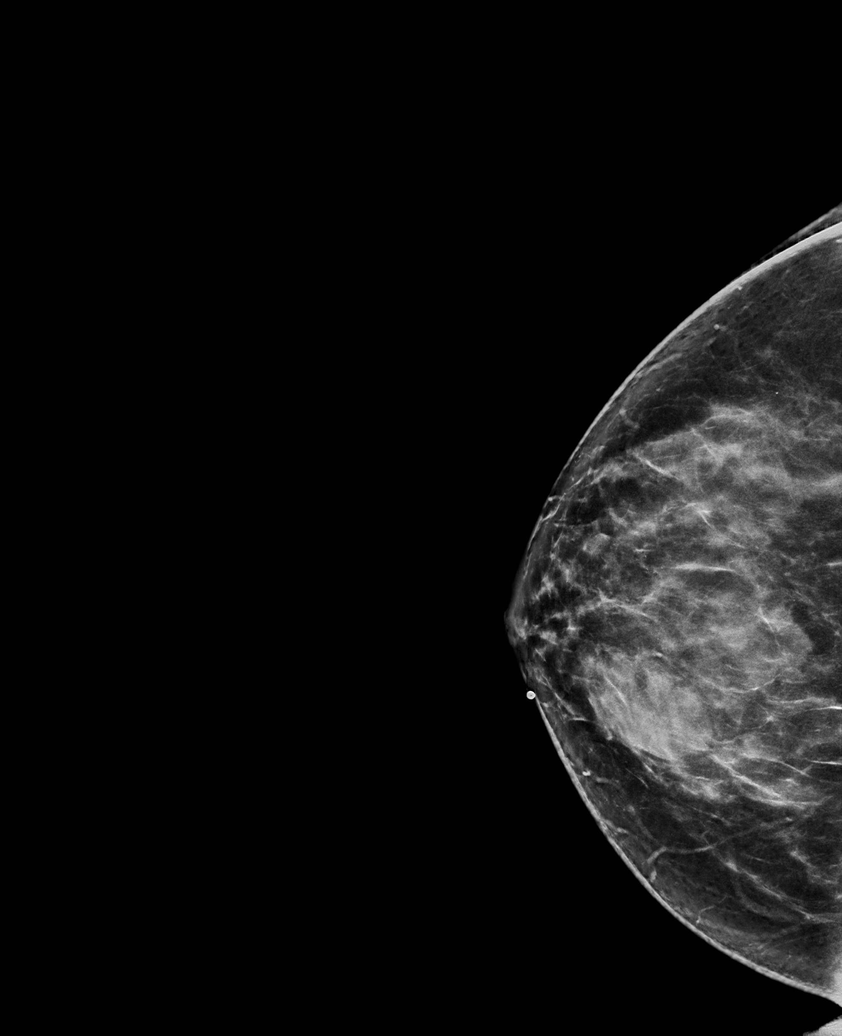

[L CC synth-2D]
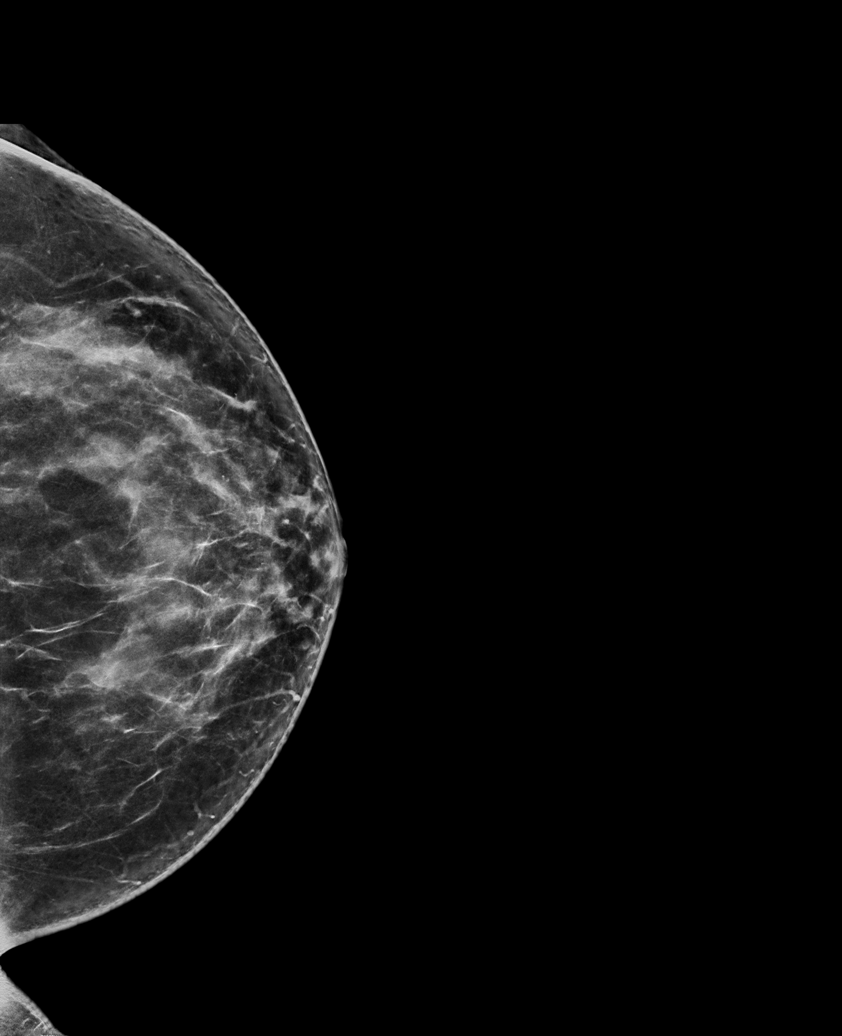

[R TAN synth-2D]
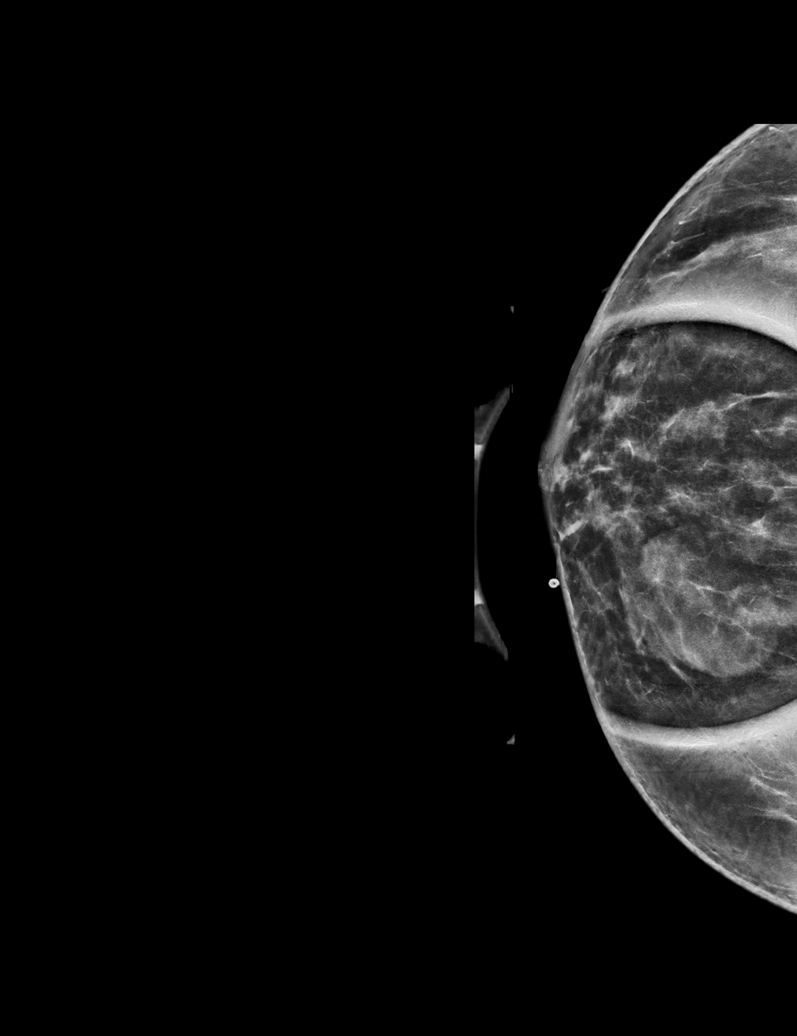

[L MLO tomo · tomo slice 47/94.0]
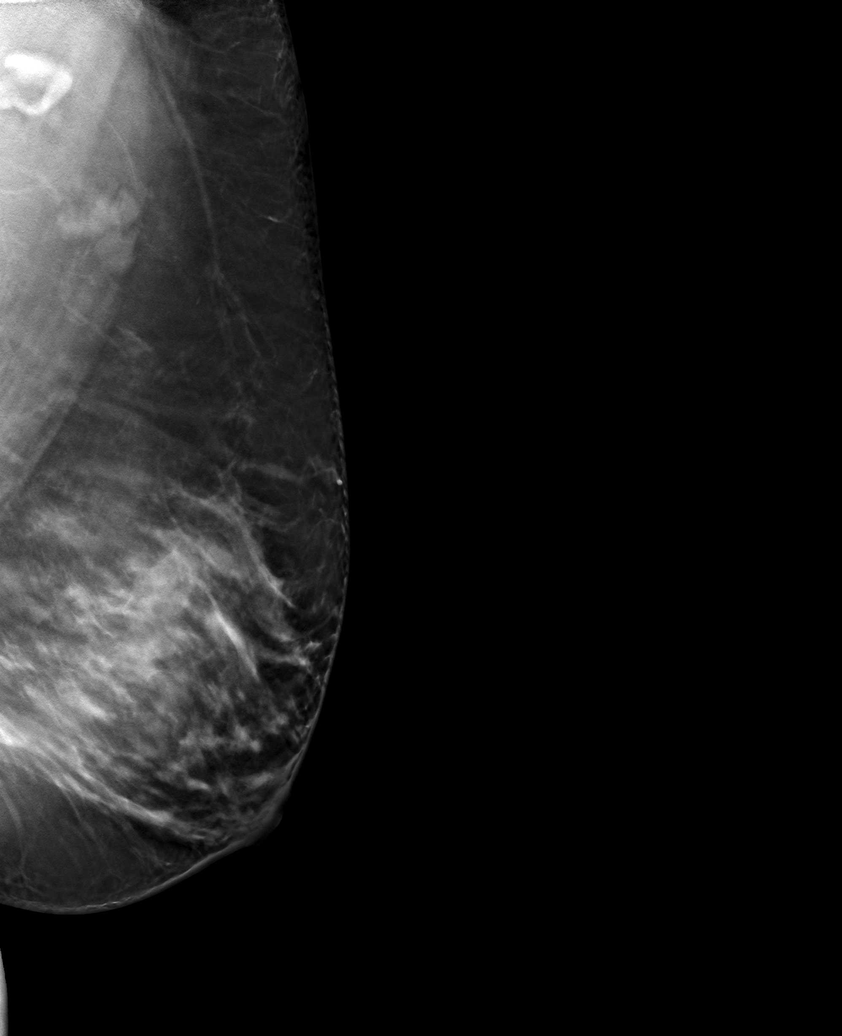

[6 of 30 positions shown; findings below may reference images not displayed]

ACR Breast Density Category c: The breast tissue is heterogeneously
dense, which may obscure small masses.
FINDINGS: There is an oval circumscribed mass in the inner right breast
measuring approximately 3.5 cm. No suspicious masses or
calcifications are seen in the left breast.

Mammographic images were processed with CAD.

Physical examination reveals a firm mobile slightly tender mass in
the upper inner quadrant of the right breast.

Targeted ultrasound of the right breast was performed demonstrating
a large cyst with an adjacent cluster of cysts at 2 o'clock 3 cm
from nipple, with the larger cyst measuring 2.3 x 1 x 3.3 cm. This
corresponds well with the mass seen in the right breast at
mammography.
IMPRESSION: 1. Right breast cyst at site of palpable concern with associated
tenderness.

2.  No findings of malignancy in either breast.

RECOMMENDATION:
1.  Screening mammogram in one year.(Code:FW-3-JTS)

2. Cyst aspiration could be performed if the patient experiences
continued discomfort at the site of the right breast cyst. She will
call the [REDACTED] to schedule if she decides on cyst
aspiration.

I have discussed the findings and recommendations with the patient.
Results were also provided in writing at the conclusion of the
visit. If applicable, a reminder letter will be sent to the patient
regarding the next appointment.

BI-RADS CATEGORY  2: Benign.

## 2018-12-31 IMAGING — US US ASPIRATION RIGHT BREAST
2 series · 4 of 4 positions shown · non-contrast
Comparison: 08/13/2017 and earlier

CLINICAL DATA: Patient presents for ultrasound-guided cyst
aspiration for pain relief.

EXAM:
ULTRASOUND GUIDED RIGHT BREAST CYST ASPIRATION

[Series 1: us aspiration right breast · 0.07mm/px · 2 of 2 slices shown (1 of 2)]
[im 1/2]
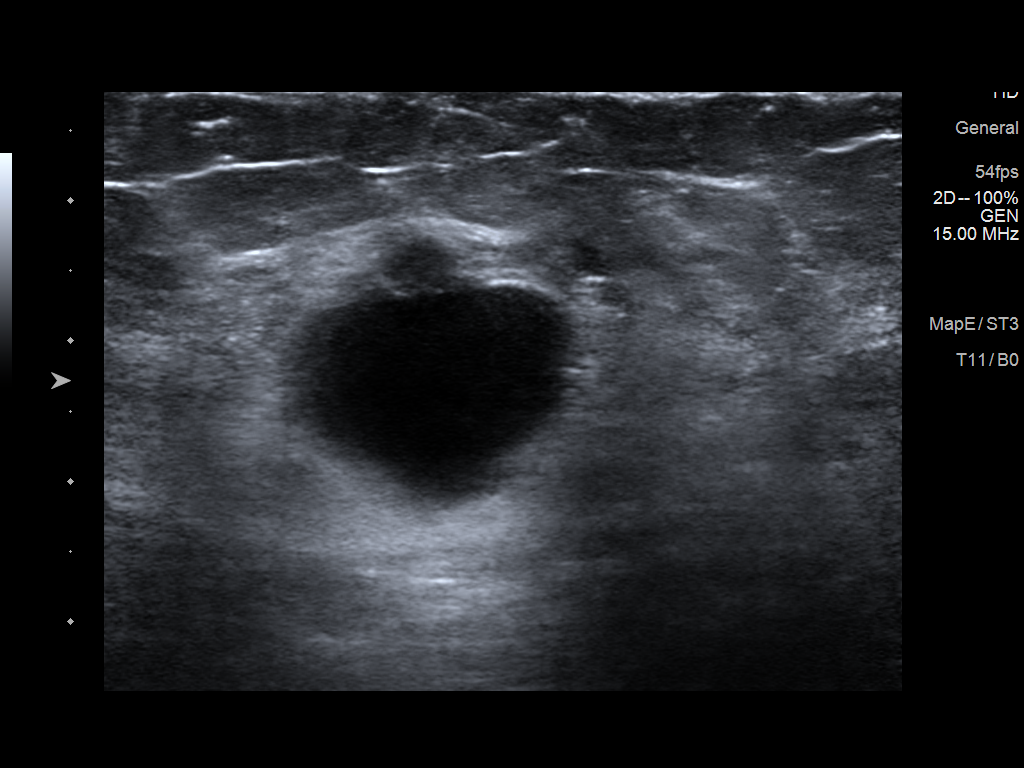
[im 2/2]
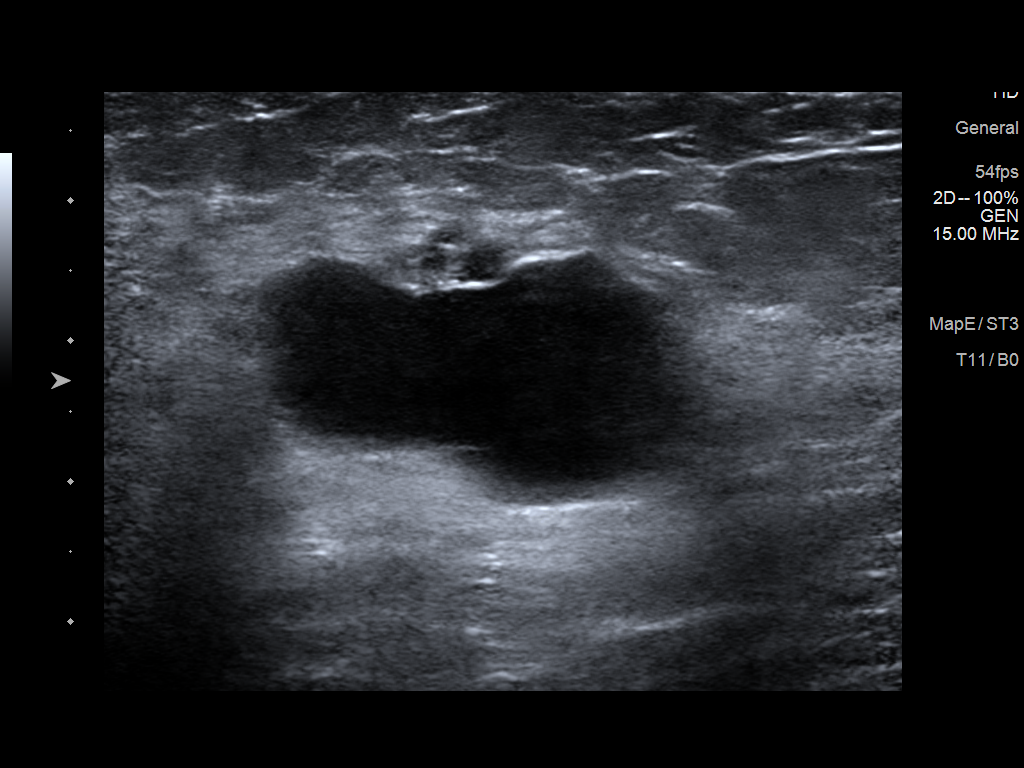

[Series 2: us aspiration right breast · 0.07mm/px · 2 of 2 slices shown (2 of 2)]
[im 1/2]
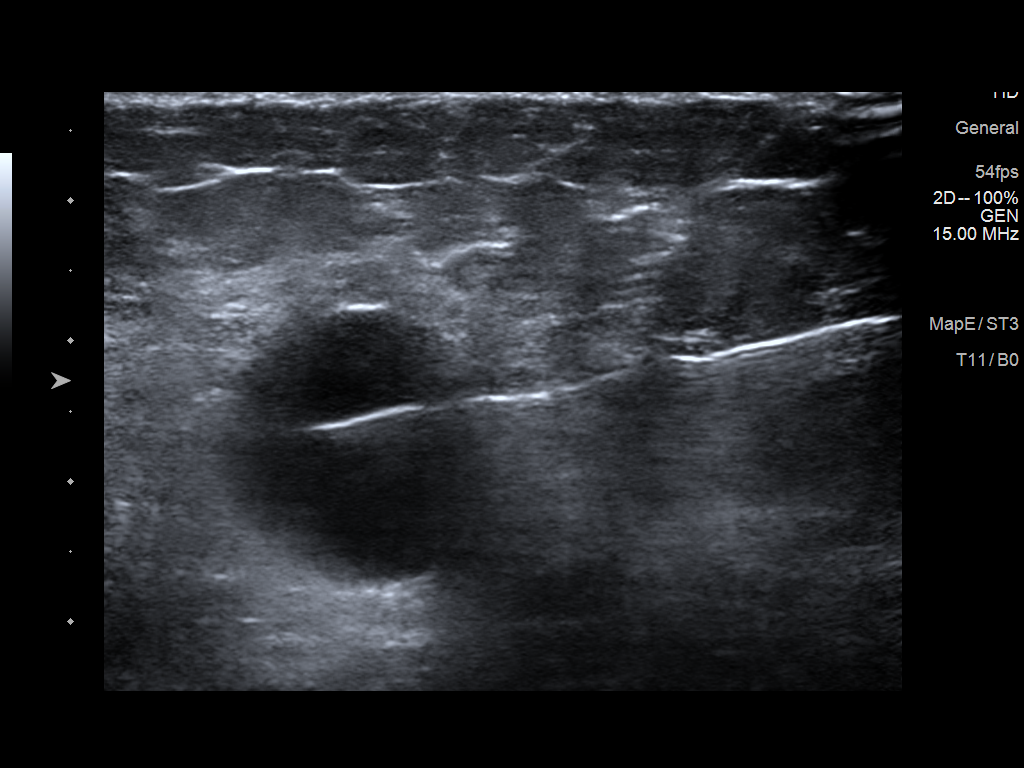
[im 2/2]
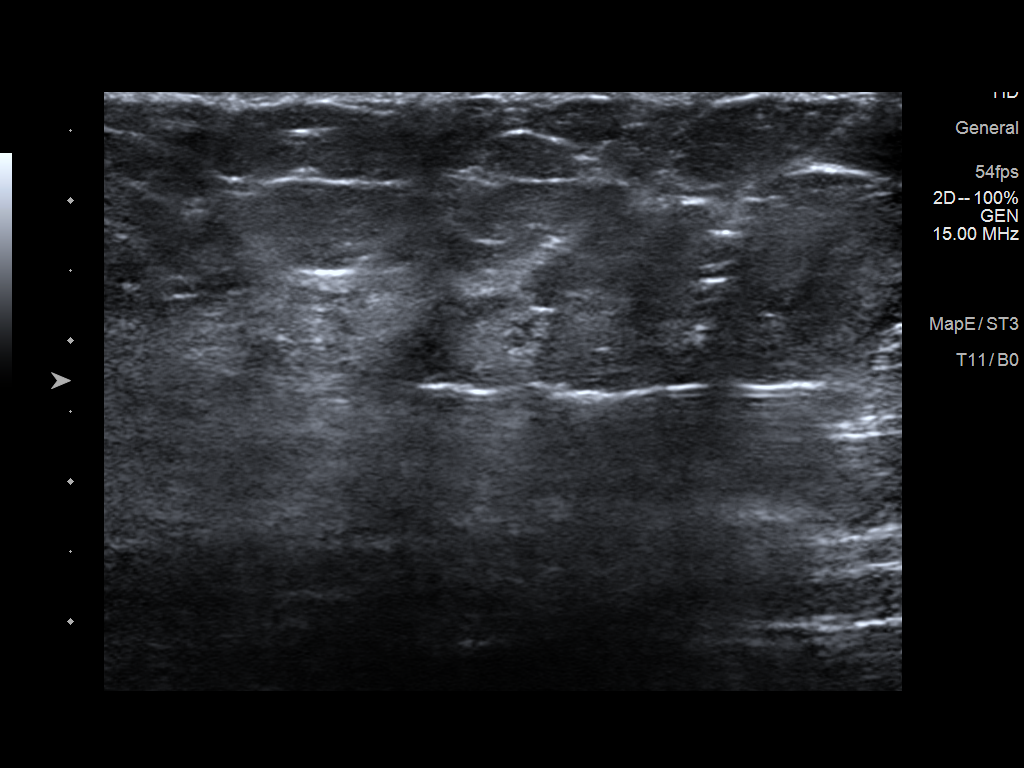

[4 of 4 positions shown; findings below may reference images not displayed]

PROCEDURE:
Using sterile technique, 1% lidocaine, under direct ultrasound
visualization, needle aspiration of cyst in the 2 o'clock location
of the RIGHT breast was performed. 7 ml of clear yellowish fluid was
aspirated and discarded. The cyst collapsed completely. No residual
solid or fluid component.
IMPRESSION: Ultrasound-guided aspiration of RIGHT breast cyst no apparent
complications.

RECOMMENDATIONS:
Screening mammogram is recommended in August 2019.

## 2019-04-07 ENCOUNTER — Encounter: Payer: Self-pay | Admitting: Internal Medicine

## 2019-04-07 ENCOUNTER — Ambulatory Visit (INDEPENDENT_AMBULATORY_CARE_PROVIDER_SITE_OTHER): Payer: 59 | Admitting: Internal Medicine

## 2019-04-07 ENCOUNTER — Other Ambulatory Visit: Payer: Self-pay

## 2019-04-07 VITALS — BP 118/78 | HR 72 | Temp 97.7°F | Ht 63.5 in | Wt 220.8 lb

## 2019-04-07 DIAGNOSIS — E66812 Obesity, class 2: Secondary | ICD-10-CM

## 2019-04-07 DIAGNOSIS — F411 Generalized anxiety disorder: Secondary | ICD-10-CM | POA: Diagnosis not present

## 2019-04-07 DIAGNOSIS — Z6838 Body mass index (BMI) 38.0-38.9, adult: Secondary | ICD-10-CM | POA: Diagnosis not present

## 2019-04-07 DIAGNOSIS — F321 Major depressive disorder, single episode, moderate: Secondary | ICD-10-CM

## 2019-04-07 MED ORDER — VIIBRYD 20 MG PO TABS: 20.0000 mg | ORAL_TABLET | Freq: Every day | ORAL | 0 refills | Status: DC

## 2019-04-07 NOTE — Patient Instructions (Signed)

## 2019-04-12 NOTE — Progress Notes (Signed)
Subjective:     Patient ID: Tammie Williams , female    DOB: 12/19/1970 , 48 y.o.   MRN: [id number]   Chief Complaint  Patient presents with  . Citalopram f/u  . Immunizations    Tdap    HPI  She is here today for f/u anxiety. She reports compliance with citalopram. She does not feel it is effective.  She feels her anxiety has worsened given COVID situation. She does not have any thoughts to harm herself.     Past Medical History:  Diagnosis Date  . Anemia   . Anemia   . Anxiety   . Asthma   . GERD (gastroesophageal reflux disease) 01/01/2018  . Near syncope 01/01/2018  . Palpitations 01/01/2018  . Sickle cell trait (HCC)   . Vitamin D deficiency      Family History  Problem Relation Age of Onset  . Healthy Mother   . Healthy Father   . CAD Sister   . Stroke Maternal Grandmother      Current Outpatient Medications:  .  cetirizine (ZYRTEC) 10 MG tablet, Take 10 mg by mouth daily., Disp: , Rfl:  .  citalopram (CELEXA) 10 MG tablet, Take 10 mg by mouth daily., Disp: , Rfl:  .  Fe Fum-Fe Poly-Vit C-Lactobac (FUSION) 65-65-25-30 MG CAPS, Take 1 capsule by mouth daily., Disp: 30 capsule, Rfl: 5 .  ibuprofen (ADVIL,MOTRIN) 800 MG tablet, Take 1 tablet (800 mg total) by mouth every 8 (eight) hours as needed., Disp: 21 tablet, Rfl: 0 .  Vilazodone HCl (VIIBRYD) 20 MG TABS, Take 1 tablet (20 mg total) by mouth daily., Disp: 30 tablet, Rfl: 0   No Known Allergies   Review of Systems  Constitutional: Negative.   Respiratory: Negative.   Cardiovascular: Negative.   Gastrointestinal: Negative.   Neurological: Negative.   Psychiatric/Behavioral: Negative.      Today's Vitals   04/07/19 1414  BP: 118/78  Pulse: 72  Temp: 97.7 F (36.5 C)  TempSrc: Oral  Weight: 220 lb 12.8 oz (100.2 kg)  Height: 5' 3.5" (1.613 m)   Body mass index is 38.5 kg/m.   Objective:  Physical Exam Vitals signs and nursing note reviewed.  Constitutional:      Appearance: Normal appearance.  She is obese.  HENT:     Head: Normocephalic and atraumatic.  Cardiovascular:     Rate and Rhythm: Normal rate and regular rhythm.     Heart sounds: Normal heart sounds.  Pulmonary:     Effort: Pulmonary effort is normal.     Breath sounds: Normal breath sounds.  Skin:    General: Skin is warm.  Neurological:     General: No focal deficit present.     Mental Status: She is alert.  Psychiatric:        Mood and Affect: Mood normal.        Behavior: Behavior normal.         Assessment And Plan:     1. GAD (generalized anxiety disorder)  Chronic. I will d/c citalopram due to its ineffectiveness. I will switch her to Viibryd 10mg  x 7 days, then 20mg  daily. She is advised to take with evening meal.  Possible side effects were discussed with the patient. She agrees to therapy as well. I will refer her to Dr. Gloris ManchesterAkintayo's office for therapy. She is in agreement with her treatment plan.  She will f/u in 4 weeks for re-evaluation.   - Ambulatory referral to Psychology  2.  Current moderate episode of major depressive disorder, unspecified whether recurrent ([place])  Please see above.   - Ambulatory referral to Psychology  3. Class 2 severe obesity due to excess calories with serious comorbidity and body mass index (BMI) of 38.0 to 38.9 in adult [facility])  Importance of achieving optimal weight to decrease risk of cardiovascular disease and cancers was discussed with the patient in full detail. She is encouraged to start slowly - start with 10 minutes twice daily at least three to four days per week and to gradually build to 30 minutes five days weekly. She was given tips to incorporate more activity into her daily routine - take stairs when possible, park farther away from her job, grocery stores, etc.    Maximino Greenland, MD    THE PATIENT IS ENCOURAGED TO PRACTICE SOCIAL DISTANCING DUE TO THE COVID-19 PANDEMIC.

## 2019-04-13 ENCOUNTER — Ambulatory Visit: Payer: 59 | Admitting: Internal Medicine

## 2019-04-13 ENCOUNTER — Telehealth: Payer: Self-pay

## 2019-04-13 NOTE — Telephone Encounter (Signed)
The pt was notified that her [employer] form has been faxed and a copy has been placed up front for the pt to pickup.

## 2019-05-05 ENCOUNTER — Ambulatory Visit (INDEPENDENT_AMBULATORY_CARE_PROVIDER_SITE_OTHER): Payer: 59 | Admitting: Internal Medicine

## 2019-05-05 ENCOUNTER — Other Ambulatory Visit: Payer: Self-pay

## 2019-05-05 ENCOUNTER — Encounter: Payer: Self-pay | Admitting: Internal Medicine

## 2019-05-05 VITALS — BP 128/76 | HR 86 | Temp 98.1°F | Ht 63.5 in | Wt 222.0 lb

## 2019-05-05 DIAGNOSIS — F411 Generalized anxiety disorder: Secondary | ICD-10-CM | POA: Diagnosis not present

## 2019-05-05 DIAGNOSIS — F321 Major depressive disorder, single episode, moderate: Secondary | ICD-10-CM

## 2019-05-05 MED ORDER — CITALOPRAM HYDROBROMIDE 10 MG PO TABS: 10.0000 mg | ORAL_TABLET | Freq: Every day | ORAL | 2 refills | Status: DC

## 2019-05-05 NOTE — Patient Instructions (Signed)

## 2019-05-13 NOTE — Progress Notes (Signed)
  Subjective:     Patient ID: Tammie Williams , female    DOB: 1971-03-22 , 48 y.o.   MRN: [id number]   Chief Complaint  Patient presents with  . Depression    HPI  She is here today for f/u depression/anxiety. She was started on Viibryd at her last visit.  She states she did not like viibryd, wants to go back to citalopram. She reports she just didn't "feel right". She admits that she did not call the office with her concerns regarding the medication.     Past Medical History:  Diagnosis Date  . Anemia   . Anemia   . Anxiety   . Asthma   . GERD (gastroesophageal reflux disease) 01/01/2018  . Near syncope 01/01/2018  . Palpitations 01/01/2018  . Sickle cell trait ([place])   . Vitamin D deficiency      Family History  Problem Relation Age of Onset  . Healthy Mother   . Healthy Father   . CAD Sister   . Stroke Maternal Grandmother      Current Outpatient Medications:  .  cetirizine (ZYRTEC) 10 MG tablet, Take 10 mg by mouth daily., Disp: , Rfl:  .  Fe Fum-Fe Poly-Vit C-Lactobac (FUSION) 65-65-25-30 MG CAPS, Take 1 capsule by mouth daily., Disp: 30 capsule, Rfl: 5 .  citalopram (CELEXA) 10 MG tablet, Take 1 tablet (10 mg total) by mouth daily., Disp: 30 tablet, Rfl: 2   No Known Allergies   Review of Systems  Constitutional: Negative.   Respiratory: Negative.   Cardiovascular: Negative.   Gastrointestinal: Negative.   Neurological: Negative.   Psychiatric/Behavioral: Positive for dysphoric mood. The patient is nervous/anxious.      Today's Vitals   05/05/19 1504  BP: 128/76  Pulse: 86  Temp: 98.1 F (36.7 C)  TempSrc: Oral  Weight: 222 lb (100.7 kg)  Height: 5' 3.5" (1.613 m)   Body mass index is 38.71 kg/m.   Objective:  Physical Exam Vitals signs and nursing note reviewed.  Constitutional:      Appearance: Normal appearance.  HENT:     Head: Normocephalic and atraumatic.  Cardiovascular:     Rate and Rhythm: Normal rate and regular rhythm.     Heart  sounds: Normal heart sounds.  Pulmonary:     Effort: Pulmonary effort is normal.     Breath sounds: Normal breath sounds.  Skin:    General: Skin is warm.  Neurological:     General: No focal deficit present.     Mental Status: She is alert.  Psychiatric:        Mood and Affect: Mood normal.        Behavior: Behavior normal.         Assessment And Plan:     1. GAD (generalized anxiety disorder)  Chronic. I will restart citalopram as requested. She was advised to touch base in four weeks to let me know how she is doing. We will discuss dose increase at that time. She will rto in six weeks for re-evaluation. She is encouraged to contact me immediately if she develops worsening sx and/or suicidal thoughts.   2. Current moderate episode of major depressive disorder, unspecified whether recurrent ([place])  Please see above. She is encouraged to set up counseling through [employer] at work.   Maximino Greenland, MD    THE PATIENT IS ENCOURAGED TO PRACTICE SOCIAL DISTANCING DUE TO THE COVID-19 PANDEMIC.

## 2019-06-23 ENCOUNTER — Ambulatory Visit (HOSPITAL_COMMUNITY)
Admission: EM | Admit: 2019-06-23 | Discharge: 2019-06-23 | Disposition: A | Discharge status: Home / Self Care | Payer: 59 | Attending: Family Medicine | Admitting: Family Medicine

## 2019-06-23 ENCOUNTER — Encounter (HOSPITAL_COMMUNITY): Payer: Self-pay | Admitting: Emergency Medicine

## 2019-06-23 ENCOUNTER — Other Ambulatory Visit: Payer: Self-pay

## 2019-06-23 DIAGNOSIS — L299 Pruritus, unspecified: Secondary | ICD-10-CM

## 2019-06-23 MED ORDER — HYDROXYZINE HCL 50 MG PO TABS: ORAL_TABLET | ORAL | 0 refills | Status: DC

## 2019-06-23 MED ORDER — PERMETHRIN 5 % EX CREA: TOPICAL_CREAM | CUTANEOUS | 0 refills | Status: DC

## 2019-06-23 NOTE — ED Triage Notes (Signed)
PT reports itching that started over lower legs, but now itching is generalized. Present for 2 weeks. PT has not had a rash or hives develop. Has tried benadryl.   Itching comes and goes. PT moved recently. An exterminator has checked her new home twice.   No new detergents, soaps, or medications.

## 2019-06-29 NOTE — ED Provider Notes (Signed)
[facility]   [id number] 06/23/19 Arrival Time: 1858  ASSESSMENT & PLAN:  1. Pruritus     Will treat emprically for scabies. Monitor symptoms. See AVS for d/c instructions.  Meds ordered this encounter  Medications  . permethrin (ELIMITE) 5 % cream    Sig: Apply from neck down before bed then wash off in the morning. May repeat in one week.    Dispense:  60 g    Refill:  0  . hydrOXYzine (ATARAX/VISTARIL) 50 MG tablet    Sig: Take 1-2 tablets twice daily for itching.    Dispense:  30 tablet    Refill:  0    Will follow up with PCP or here if worsening or failing to improve as anticipated. Reviewed expectations re: course of current medical issues. Questions answered. Outlined signs and symptoms indicating need for more acute intervention. Patient verbalized understanding. After Visit Summary given.   SUBJECTIVE:  Tammie Williams is a 48 y.o. female who presents with complaint of generalized itching. Started lower legs, "now all over". Approx two weeks. Gradual onset. No specific aggravating or alleviating factors reported. OTC Benadryl with mild relief. No new exposures or medications. No rash. No h/o similar. Afebrile. No recent illnesses. No recent travel reported.  ROS: As per HPI. Patient is alert and oriented times three.   OBJECTIVE: Vitals:   06/23/19 1920 06/23/19 1922  BP:  134/90  Pulse: 71   Resp: 16   Temp: 98.3 F (36.8 C)   TempSrc: Oral   SpO2: 98%     General appearance: alert; no distress HEENT: [place]; AT; oropharynx moist; lips normal Lungs: clear to auscultation bilaterally Heart: regular rate and rhythm Extremities: no edema Skin: warm and dry; signs of infection: no; without rashes or lesions Neuro: normal gait Psychological: alert and cooperative; normal mood and affect  No Known Allergies  Past Medical History:  Diagnosis Date  . Anemia   . Anemia   . Anxiety   . Asthma   . GERD (gastroesophageal reflux disease)  01/01/2018  . Near syncope 01/01/2018  . Palpitations 01/01/2018  . Sickle cell trait (HCC)   . Vitamin D deficiency    Social History   Socioeconomic History  . Marital status: Single    Spouse name: Not on file  . Number of children: Not on file  . Years of education: Not on file  . Highest education level: Not on file  Occupational History  . Not on file  Social Needs  . Financial resource strain: Not on file  . Food insecurity    Worry: Not on file    Inability: Not on file  . Transportation needs    Medical: Not on file    Non-medical: Not on file  Tobacco Use  . Smoking status: Never Smoker  . Smokeless tobacco: Never Used  Substance and Sexual Activity  . Alcohol use: No  . Drug use: No  . Sexual activity: Yes    Birth control/protection: None  Lifestyle  . Physical activity    Days per week: Not on file    Minutes per session: Not on file  . Stress: Not on file  Relationships  . Social Musician on phone: Not on file    Gets together: Not on file    Attends religious service: Not on file    Active member of club or organization: Not on file    Attends meetings of clubs or organizations: Not on  file    Relationship status: Not on file  . Intimate partner violence    Fear of current or ex partner: Not on file    Emotionally abused: Not on file    Physically abused: Not on file    Forced sexual activity: Not on file  Other Topics Concern  . Not on file  Social History Narrative  . Not on file   Family History  Problem Relation Age of Onset  . Healthy Mother   . Healthy Father   . CAD Sister   . Stroke Maternal Grandmother    Past Surgical History:  Procedure Laterality Date  . CESAREAN SECTION    . WISDOM TOOTH EXTRACTION       Vanessa Kick, MD 06/29/19 [phone]

## 2019-07-20 ENCOUNTER — Telehealth: Payer: Self-pay

## 2019-07-20 NOTE — Telephone Encounter (Signed)
I left the pt a message that the office was returning her call to schedule the pt a virtual appt.  The pt left a message that she was treated at the er for itching and that she is still itching and wanted to know if she needed an appt or if she could get a dermatology referral.

## 2019-07-21 ENCOUNTER — Other Ambulatory Visit: Payer: Self-pay

## 2019-07-21 ENCOUNTER — Encounter: Payer: Self-pay | Admitting: Nurse Practitioner

## 2019-07-21 ENCOUNTER — Telehealth (INDEPENDENT_AMBULATORY_CARE_PROVIDER_SITE_OTHER): Payer: 59 | Admitting: Nurse Practitioner

## 2019-07-21 VITALS — Ht 64.0 in | Wt 220.0 lb

## 2019-07-21 DIAGNOSIS — R21 Rash and other nonspecific skin eruption: Secondary | ICD-10-CM | POA: Diagnosis not present

## 2019-07-21 DIAGNOSIS — L299 Pruritus, unspecified: Secondary | ICD-10-CM | POA: Diagnosis not present

## 2019-07-21 MED ORDER — PREDNISONE 10 MG (21) PO TBPK: ORAL_TABLET | ORAL | 0 refills | Status: DC

## 2019-07-21 NOTE — Progress Notes (Signed)
Virtual Visit via Video   This visit type was conducted due to national recommendations for restrictions regarding the COVID-19 Pandemic (e.g. social distancing) in an effort to limit this patient's exposure and mitigate transmission in our community.  Due to her co-morbid illnesses, this patient is at least at moderate risk for complications without adequate follow up.  This format is felt to be most appropriate for this patient at this time.  All issues noted in this document were discussed and addressed.  A limited physical exam was performed with this format.    This visit type was conducted due to national recommendations for restrictions regarding the COVID-19 Pandemic (e.g. social distancing) in an effort to limit this patient's exposure and mitigate transmission in our community.  Patients identity confirmed using two different identifiers.  This format is felt to be most appropriate for this patient at this time.  All issues noted in this document were discussed and addressed.  No physical exam was performed (except for noted visual exam findings with Video Visits).    Date:  07/21/2019   ID:  Tammie Williams, DOB 28-Apr-1971, MRN [id number]  Patient Location:  Home - spoke with Tammie Williams  Provider location:   Office    Chief Complaint:  Pruiritis  History of Present Illness:    Tammie Williams is a 48 y.o. female who presents via video conferencing for a telehealth visit today.    The patient does not have symptoms concerning for COVID-19 infection (fever, chills, cough, or new shortness of breath).   She has been itching since the end of August after moving into a new apartment, she has had an exterminator came out 4 times, has traps out for carpet beetles. Usually at night.  She went to Urgent Care early September.  No rash at that time. Also treated as scabies or , she has only been home.  She has used the premetherin cream all over.  No new medications or new detergents.   Lysol wash.  She has cleaned her carpet as well  Rash This is a recurrent problem. The current episode started more than 1 month ago. The problem has been gradually worsening since onset. The affected locations include the right lower leg and left lower leg. The rash is characterized by itchiness. She was exposed to chemicals. Past treatments include anti-itch cream and moisturizer (premetherin). The treatment provided mild relief. There is no history of asthma or eczema.     Past Medical History:  Diagnosis Date  . Anemia   . Anemia   . Anxiety   . Asthma   . GERD (gastroesophageal reflux disease) 01/01/2018  . Near syncope 01/01/2018  . Palpitations 01/01/2018  . Sickle cell trait ([place])   . Vitamin D deficiency    Past Surgical History:  Procedure Laterality Date  . CESAREAN SECTION    . WISDOM TOOTH EXTRACTION       Current Meds  Medication Sig  . cetirizine (ZYRTEC) 10 MG tablet Take 10 mg by mouth daily.  . citalopram (CELEXA) 10 MG tablet Take 1 tablet (10 mg total) by mouth daily.  . hydrocortisone cream 0.5 % Apply 1 application topically 2 (two) times daily.  . hydrOXYzine (ATARAX/VISTARIL) 50 MG tablet Take 1-2 tablets twice daily for itching.  . permethrin (ELIMITE) 5 % cream Apply from neck down before bed then wash off in the morning. May repeat in one week.     Allergies:   Patient has no known  allergies.   Social History   Tobacco Use  . Smoking status: Never Smoker  . Smokeless tobacco: Never Used  Substance Use Topics  . Alcohol use: No  . Drug use: No     Family Hx: The patient's family history includes CAD in her sister; Healthy in her father and mother; Stroke in her maternal grandmother.  ROS:   Please see the history of present illness.    Review of Systems  Constitutional: Negative.   Respiratory: Negative.   Cardiovascular: Negative.   Skin: Positive for itching and rash.  Psychiatric/Behavioral: The patient is nervous/anxious (history of  anxiety feels like is controlled).     All other systems reviewed and are negative.   Labs/Other Tests and Data Reviewed:    Recent Labs: 10/11/2018: ALT 11; BUN 9; Creatinine, Ser 0.67; Hemoglobin 10.0; Platelets 414; Potassium 4.3; Sodium 140; TSH 1.000   Recent Lipid Panel Lab Results  Component Value Date/Time   CHOL 229 (H) 10/11/2018 10:18 AM   TRIG 186 (H) 10/11/2018 10:18 AM   HDL 53 10/11/2018 10:18 AM   CHOLHDL 4.3 10/11/2018 10:18 AM   LDLCALC 139 (H) 10/11/2018 10:18 AM    Wt Readings from Last 3 Encounters:  07/21/19 220 lb (99.8 kg)  05/05/19 222 lb (100.7 kg)  04/07/19 220 lb 12.8 oz (100.2 kg)     Exam:    Vital Signs:  Ht 5\' 4"  (1.626 m)   Wt 220 lb (99.8 kg)   LMP 06/23/2019   BMI 37.76 kg/m     Physical Exam  Constitutional: She is oriented to person, place, and time and well-developed, well-nourished, and in no distress. No distress.  Neurological: She is alert and oriented to person, place, and time.  Skin: Rash (unable to see well due to being on video and no good lighting) noted.  Psychiatric: Mood, memory, affect and judgment normal.    ASSESSMENT & PLAN:    1. Pruritus  This has been ongoing for 2 months, treated with premetherin cream and hydrocortisone without benefit  Will treat with prednisone due to may be systemic response if not better at completion of course she is to call office back and may need allergy testing. - predniSONE (STERAPRED UNI-PAK 21 TAB) 10 MG (21) TBPK tablet; Take as directed  Dispense: 21 tablet; Refill: 0   COVID-19 Education: The signs and symptoms of COVID-19 were discussed with the patient and how to seek care for testing (follow up with PCP or arrange E-visit).  The importance of social distancing was discussed today.  Patient Risk:   After full review of this patients clinical status, I feel that they are at least moderate risk at this time.  Time:   Today, I have spent 16 minutes/ seconds with the  patient with telehealth technology discussing above diagnoses.     Medication Adjustments/Labs and Tests Ordered: Current medicines are reviewed at length with the patient today.  Concerns regarding medicines are outlined above.   Tests Ordered: No orders of the defined types were placed in this encounter.   Medication Changes: No orders of the defined types were placed in this encounter.   Disposition:  Follow up prn  Signed, 06/25/2019, FNP

## 2019-08-03 ENCOUNTER — Other Ambulatory Visit: Payer: Self-pay

## 2019-08-03 MED ORDER — HYDROXYZINE HCL 50 MG PO TABS: ORAL_TABLET | ORAL | 0 refills | Status: DC

## 2019-08-03 MED ORDER — PERMETHRIN 5 % EX CREA: TOPICAL_CREAM | CUTANEOUS | 0 refills | Status: DC

## 2019-08-12 ENCOUNTER — Telehealth: Payer: Self-pay

## 2019-08-12 DIAGNOSIS — L299 Pruritus, unspecified: Secondary | ICD-10-CM

## 2019-08-12 NOTE — Telephone Encounter (Signed)
The pt was notified that Laurance Flatten, [place] said that it's ok for the pt to come and do her blood work because the pt is still having an issue itching.  The pt will have a food allergy panel done.

## 2019-08-13 ENCOUNTER — Other Ambulatory Visit: Payer: Self-pay

## 2019-08-13 ENCOUNTER — Other Ambulatory Visit: Payer: 59

## 2019-08-15 LAB — ALLERGENS(96) FOODS
Allergen Apple, IgE: <0.1 kU/L
Allergen Banana IgE: <0.1 kU/L
Allergen Barley IgE: <0.1 kU/L
Allergen Black Pepper IgE: <0.1 kU/L
Allergen Blueberry IgE: <0.1 kU/L
Allergen Broccoli: <0.1 kU/L
Allergen Cabbage IgE: <0.1 kU/L
Allergen Carrot IgE: <0.1 kU/L
Allergen Cauliflower IgE: <0.1 kU/L
Allergen Celery IgE: <0.1 kU/L
Allergen Cinnamon IgE: <0.1 kU/L
Allergen Coconut IgE: <0.1 kU/L
Allergen Corn, IgE: <0.1 kU/L
Allergen Cucumber IgE: <0.1 kU/L
Allergen Garlic IgE: <0.1 kU/L
Allergen Ginger IgE: <0.1 kU/L
Allergen Gluten IgE: <0.1 kU/L
Allergen Grape IgE: <0.1 kU/L
Allergen Grapefruit IgE: <0.1 kU/L
Allergen Green Bean IgE: <0.1 kU/L
Allergen Green Bell Pepper IgE: <0.1 kU/L
Allergen Green Pea IgE: <0.1 kU/L
Allergen Lamb IgE: <0.1 kU/L
Allergen Lettuce IgE: <0.1 kU/L
Allergen Lime IgE: <0.1 kU/L
Allergen Melon IgE: <0.1 kU/L
Allergen Oat IgE: <0.1 kU/L
Allergen Onion IgE: <0.1 kU/L
Allergen Pear IgE: <0.1 kU/L
Allergen Potato, White IgE: <0.1 kU/L
Allergen Rice IgE: <0.1 kU/L
Allergen Salmon IgE: <0.1 kU/L
Allergen Strawberry IgE: <0.1 kU/L
Allergen Sweet Potato IgE: <0.1 kU/L
Allergen Tomato, IgE: <0.1 kU/L
Allergen Turkey IgE: <0.1 kU/L
Allergen Watermelon IgE: <0.1 kU/L
Allergen, Peach f95: <0.1 kU/L
Basil: <0.1 kU/L
Beef IgE: <0.1 kU/L
C074-IgE Gelatin: <0.1 kU/L
Chicken IgE: <0.1 kU/L
Chocolate/Cacao IgE: <0.1 kU/L
Clam IgE: <0.1 kU/L
Codfish IgE: <0.1 kU/L
Coffee: <0.1 kU/L
Cranberry IgE: <0.1 kU/L
Egg White IgE: <0.1 kU/L
F020-IgE Almond: <0.1 kU/L
F023-IgE Crab: <0.1 kU/L
F045-IgE Yeast: <0.1 kU/L
F076-IgE Alpha Lactalbumin: <0.1 kU/L
F077-IgE Beta Lactoglobulin: <0.1 kU/L
F078-IgE Casein: <0.1 kU/L
F080-IgE Lobster: <0.1 kU/L
F081-IgE Cheese, Cheddar Type: <0.1 kU/L
F089-IgE Mustard: <0.1 kU/L
F096-IgE Avocado: <0.1 kU/L
F202-IgE Cashew Nut: <0.1 kU/L
F214-IgE Spinach: <0.1 kU/L
F222-IgE Tea: <0.1 kU/L
F242-IgE Bing Cherry: <0.1 kU/L
F247-IgE Honey: <0.1 kU/L
F261-IgE Asparagus: <0.1 kU/L
F262-IgE Eggplant: <0.1 kU/L
F265-IgE Cumin: <0.1 kU/L
F278-IgE Bayleaf (Laurel): <0.1 kU/L
F279-IgE Chili Pepper: <0.1 kU/L
F283-IgE Oregano: <0.1 kU/L
F300-IgE Goat's Milk: <0.1 kU/L
F342-IgE Olive, Black: <0.1 kU/L
F343-IgE Raspberry: <0.1 kU/L
Hops: <0.1 kU/L
IgE Egg (Yolk): <0.1 kU/L
Kidney Bean IgE: <0.1 kU/L
Lemon: <0.1 kU/L
Lima Bean IgE: <0.1 kU/L
Malt: <0.1 kU/L
Mushroom IgE: <0.1 kU/L
Orange: <0.1 kU/L
Peanut IgE: <0.1 kU/L
Pineapple IgE: <0.1 kU/L
Pork IgE: <0.1 kU/L
Pumpkin IgE: <0.1 kU/L
Red Beet: <0.1 kU/L
Rye IgE: <0.1 kU/L
Scallop IgE: 0.11 kU/L — AB
Sesame Seed IgE: <0.1 kU/L
Shrimp IgE: <0.1 kU/L
Soybean IgE: <0.1 kU/L
Tuna: <0.1 kU/L
Vanilla: <0.1 kU/L
Walnut IgE: <0.1 kU/L
Wheat IgE: <0.1 kU/L
Whey: <0.1 kU/L
White Bean IgE: <0.1 kU/L

## 2019-08-23 ENCOUNTER — Encounter: Payer: Self-pay | Admitting: Internal Medicine

## 2019-08-24 ENCOUNTER — Telehealth: Payer: Self-pay

## 2019-08-24 NOTE — Telephone Encounter (Signed)
Patient called stating she now has spots on her skin and she doesn't know what it is from and she stated she did go to the dermatologist. I advised her to given them a call. YRL,RMA

## 2019-08-26 ENCOUNTER — Other Ambulatory Visit: Payer: Self-pay | Admitting: Nurse Practitioner

## 2019-08-26 DIAGNOSIS — L299 Pruritus, unspecified: Secondary | ICD-10-CM

## 2019-08-26 NOTE — Telephone Encounter (Signed)
Spoke with patient via phone to clarify what her most recent mychart message was stating. She is concerned because she continues to have itching in spite of prednisone taper, permethrin cream and a visit to dermatology. I will have her to come in for lab visit on Mond Nov 30th for CBC and CMP to evaluate for another cause. She is in the process of trying to move from her current apartment.

## 2019-08-31 ENCOUNTER — Other Ambulatory Visit: Payer: Self-pay | Admitting: Nurse Practitioner

## 2019-09-01 ENCOUNTER — Encounter: Payer: Self-pay | Admitting: Nurse Practitioner

## 2019-09-01 LAB — CMP14+EGFR
ALT: 17 IU/L (ref 0–32)
AST: 23 IU/L (ref 0–40)
Albumin/Globulin Ratio: 1.5 (ref 1.2–2.2)
Albumin: 4.1 g/dL (ref 3.8–4.8)
Alkaline Phosphatase: 146 IU/L — ABNORMAL HIGH (ref 39–117)
BUN/Creatinine Ratio: 13 (ref 9–23)
BUN: 10 mg/dL (ref 6–24)
Bilirubin Total: 0.2 mg/dL (ref 0.0–1.2)
CO2: 21 mmol/L (ref 20–29)
Calcium: 9.6 mg/dL (ref 8.7–10.2)
Chloride: 104 mmol/L (ref 96–106)
Creatinine, Ser: 0.77 mg/dL (ref 0.57–1.00)
GFR calc Af Amer: 107 mL/min/{1.73_m2} (ref >59)
GFR calc non Af Amer: 93 mL/min/{1.73_m2} (ref >59)
Globulin, Total: 2.8 g/dL (ref 1.5–4.5)
Glucose: 80 mg/dL (ref 65–99)
Potassium: 4.3 mmol/L (ref 3.5–5.2)
Sodium: 141 mmol/L (ref 134–144)
Total Protein: 6.9 g/dL (ref 6.0–8.5)

## 2019-09-01 LAB — CBC
Hematocrit: 36.2 % (ref 34.0–46.6)
Hemoglobin: 11.4 g/dL (ref 11.1–15.9)
MCH: 24.7 pg — ABNORMAL LOW (ref 26.6–33.0)
MCHC: 31.5 g/dL (ref 31.5–35.7)
MCV: 78 fL — ABNORMAL LOW (ref 79–97)
Platelets: 537 10*3/uL — ABNORMAL HIGH (ref 150–450)
RBC: 4.62 x10E6/uL (ref 3.77–5.28)
RDW: 15.8 % — ABNORMAL HIGH (ref 11.7–15.4)
WBC: 10.4 10*3/uL (ref 3.4–10.8)

## 2019-09-15 ENCOUNTER — Other Ambulatory Visit: Payer: Self-pay | Admitting: Nurse Practitioner

## 2019-09-15 DIAGNOSIS — L299 Pruritus, unspecified: Secondary | ICD-10-CM

## 2019-09-15 MED ORDER — HYDROXYZINE HCL 50 MG PO TABS: ORAL_TABLET | ORAL | 0 refills | Status: DC

## 2019-09-17 ENCOUNTER — Encounter: Payer: Self-pay | Admitting: Nurse Practitioner

## 2019-09-23 LAB — SPECIMEN STATUS REPORT

## 2019-09-23 LAB — IRON AND TIBC
Iron Saturation: 6 % — CL (ref 15–55)
Iron: 25 ug/dL — ABNORMAL LOW (ref 27–159)
Total Iron Binding Capacity: 410 ug/dL (ref 250–450)
UIBC: 385 ug/dL (ref 131–425)

## 2019-09-23 LAB — B12 AND FOLATE PANEL
Folate: >20 ng/mL (ref >3.0)
Vitamin B-12: 1408 pg/mL — ABNORMAL HIGH (ref 232–1245)

## 2019-09-23 LAB — FERRITIN: Ferritin: 11 ng/mL — ABNORMAL LOW (ref 15–150)

## 2019-09-26 ENCOUNTER — Other Ambulatory Visit: Payer: Self-pay | Admitting: Nurse Practitioner

## 2019-09-26 MED ORDER — FUSION 65-65-25-30 MG PO CAPS: 1.0000 | ORAL_CAPSULE | Freq: Every day | ORAL | 5 refills | Status: DC

## 2019-10-14 ENCOUNTER — Encounter: Payer: Self-pay | Admitting: Internal Medicine

## 2019-10-15 ENCOUNTER — Other Ambulatory Visit: Payer: Self-pay

## 2019-10-15 ENCOUNTER — Encounter: Payer: Self-pay | Admitting: Internal Medicine

## 2019-10-15 ENCOUNTER — Ambulatory Visit (INDEPENDENT_AMBULATORY_CARE_PROVIDER_SITE_OTHER): Payer: 59 | Admitting: Internal Medicine

## 2019-10-15 VITALS — BP 112/70 | HR 80 | Temp 98.5°F | Ht 64.0 in | Wt 209.8 lb

## 2019-10-15 DIAGNOSIS — Z0000 Encounter for general adult medical examination without abnormal findings: Secondary | ICD-10-CM | POA: Diagnosis not present

## 2019-10-15 DIAGNOSIS — M79641 Pain in right hand: Secondary | ICD-10-CM | POA: Diagnosis not present

## 2019-10-15 DIAGNOSIS — Z23 Encounter for immunization: Secondary | ICD-10-CM | POA: Diagnosis not present

## 2019-10-15 DIAGNOSIS — Z6836 Body mass index (BMI) 36.0-36.9, adult: Secondary | ICD-10-CM | POA: Diagnosis not present

## 2019-10-15 DIAGNOSIS — R202 Paresthesia of skin: Secondary | ICD-10-CM | POA: Diagnosis not present

## 2019-10-15 LAB — POCT URINALYSIS DIPSTICK
Bilirubin, UA: NEGATIVE
Glucose, UA: NEGATIVE
Ketones, UA: NEGATIVE
Leukocytes, UA: NEGATIVE
Nitrite, UA: NEGATIVE
Protein, UA: POSITIVE — AB
Spec Grav, UA: 1.025 (ref 1.010–1.025)
Urobilinogen, UA: 0.2 E.U./dL
pH, UA: 7 (ref 5.0–8.0)

## 2019-10-15 NOTE — Patient Instructions (Signed)
Health Maintenance, Female Adopting a healthy lifestyle and getting preventive care are important in promoting health and wellness. Ask your health care provider about:  The right schedule for you to have regular tests and exams.  Things you can do on your own to prevent diseases and keep yourself healthy. What should I know about diet, weight, and exercise? Eat a healthy diet   Eat a diet that includes plenty of vegetables, fruits, low-fat dairy products, and lean protein.  Do not eat a lot of foods that are high in solid fats, added sugars, or sodium. Maintain a healthy weight Body mass index (BMI) is used to identify weight problems. It estimates body fat based on height and weight. Your health care provider can help determine your BMI and help you achieve or maintain a healthy weight. Get regular exercise Get regular exercise. This is one of the most important things you can do for your health. Most adults should:  Exercise for at least 150 minutes each week. The exercise should increase your heart rate and make you sweat (moderate-intensity exercise).  Do strengthening exercises at least twice a week. This is in addition to the moderate-intensity exercise.  Spend less time sitting. Even light physical activity can be beneficial. Watch cholesterol and blood lipids Have your blood tested for lipids and cholesterol at 47 years of age, then have this test every 5 years. Have your cholesterol levels checked more often if:  Your lipid or cholesterol levels are high.  You are older than 47 years of age.  You are at high risk for heart disease. What should I know about cancer screening? Depending on your health history and family history, you may need to have cancer screening at various ages. This may include screening for:  Breast cancer.  Cervical cancer.  Colorectal cancer.  Skin cancer.  Lung cancer. What should I know about heart disease, diabetes, and high blood  pressure? Blood pressure and heart disease  High blood pressure causes heart disease and increases the risk of stroke. This is more likely to develop in people who have high blood pressure readings, are of African descent, or are overweight.  Have your blood pressure checked: ? Every 3-5 years if you are 18-39 years of age. ? Every year if you are 40 years old or older. Diabetes Have regular diabetes screenings. This checks your fasting blood sugar level. Have the screening done:  Once every three years after age 40 if you are at a normal weight and have a low risk for diabetes.  More often and at a younger age if you are overweight or have a high risk for diabetes. What should I know about preventing infection? Hepatitis B If you have a higher risk for hepatitis B, you should be screened for this virus. Talk with your health care provider to find out if you are at risk for hepatitis B infection. Hepatitis C Testing is recommended for:  Everyone born from 1945 through 1965.  Anyone with known risk factors for hepatitis C. Sexually transmitted infections (STIs)  Get screened for STIs, including gonorrhea and chlamydia, if: ? You are sexually active and are younger than 47 years of age. ? You are older than 47 years of age and your health care provider tells you that you are at risk for this type of infection. ? Your sexual activity has changed since you were last screened, and you are at increased risk for chlamydia or gonorrhea. Ask your health care provider if   you are at risk.  Ask your health care provider about whether you are at high risk for HIV. Your health care provider may recommend a prescription medicine to help prevent HIV infection. If you choose to take medicine to prevent HIV, you should first get tested for HIV. You should then be tested every 3 months for as long as you are taking the medicine. Pregnancy  If you are about to stop having your period (premenopausal) and  you may become pregnant, seek counseling before you get pregnant.  Take 400 to 800 micrograms (mcg) of folic acid every day if you become pregnant.  Ask for birth control (contraception) if you want to prevent pregnancy. Osteoporosis and menopause Osteoporosis is a disease in which the bones lose minerals and strength with aging. This can result in bone fractures. If you are 65 years old or older, or if you are at risk for osteoporosis and fractures, ask your health care provider if you should:  Be screened for bone loss.  Take a calcium or vitamin D supplement to lower your risk of fractures.  Be given hormone replacement therapy (HRT) to treat symptoms of menopause. Follow these instructions at home: Lifestyle  Do not use any products that contain nicotine or tobacco, such as cigarettes, e-cigarettes, and chewing tobacco. If you need help quitting, ask your health care provider.  Do not use street drugs.  Do not share needles.  Ask your health care provider for help if you need support or information about quitting drugs. Alcohol use  Do not drink alcohol if: ? Your health care provider tells you not to drink. ? You are pregnant, may be pregnant, or are planning to become pregnant.  If you drink alcohol: ? Limit how much you use to 0-1 drink a day. ? Limit intake if you are breastfeeding.  Be aware of how much alcohol is in your drink. In the [place] one drink equals one 12 oz bottle of beer (355 mL), one 5 oz glass of wine (148 mL), or one 1 oz glass of hard liquor (44 mL). General instructions  Schedule regular health, dental, and eye exams.  Stay current with your vaccines.  Tell your health care provider if: ? You often feel depressed. ? You have ever been abused or do not feel safe at home. Summary  Adopting a healthy lifestyle and getting preventive care are important in promoting health and wellness.  Follow your health care provider's instructions about healthy  diet, exercising, and getting tested or screened for diseases.  Follow your health care provider's instructions on monitoring your cholesterol and blood pressure. This information is not intended to replace advice given to you by your health care provider. Make sure you discuss any questions you have with your health care provider. Document Revised: 09/10/2018 Document Reviewed: 09/10/2018 Elsevier Patient Education  2020 [employer] Inc[employer]  

## 2019-10-15 NOTE — Progress Notes (Signed)
This visit occurred during the SARS-CoV-2 public health emergency.  Safety protocols were in place, including screening questions prior to the visit, additional usage of staff PPE, and extensive cleaning of exam room while observing appropriate contact time as indicated for disinfecting solutions.  Subjective:     Patient ID: Tammie Williams , female    DOB: 18-Feb-1971 , 48 y.o.   MRN: [id number]   Chief Complaint  Patient presents with  . Annual Exam    HPI  She is here today for a full physical examination.  She is followed by Dr. Simona Huh for her pap smears. Her last visit was Dec 2020.     Past Medical History:  Diagnosis Date  . Anemia   . Anemia   . Anxiety   . Asthma   . GERD (gastroesophageal reflux disease) 01/01/2018  . Near syncope 01/01/2018  . Palpitations 01/01/2018  . Sickle cell trait ([place])   . Vitamin D deficiency      Family History  Problem Relation Age of Onset  . Healthy Mother   . Healthy Father   . CAD Sister   . Stroke Maternal Grandmother      Current Outpatient Medications:  .  citalopram (CELEXA) 10 MG tablet, Take 1 tablet (10 mg total) by mouth daily., Disp: 30 tablet, Rfl: 2 .  Fe Fum-Fe Poly-Vit C-Lactobac (FUSION) 65-65-25-30 MG CAPS, Take 1 capsule by mouth daily., Disp: 30 capsule, Rfl: 5 .  hydrocortisone cream 0.5 %, Apply 1 application topically 2 (two) times daily., Disp: , Rfl:  .  hydrOXYzine (ATARAX/VISTARIL) 50 MG tablet, Take 1-2 tablets twice daily for itching., Disp: 30 tablet, Rfl: 0 .  permethrin (ELIMITE) 5 % cream, Apply from neck down before bed then wash off in the morning. May repeat in one week., Disp: 60 g, Rfl: 0 .  cetirizine (ZYRTEC) 10 MG tablet, Take 10 mg by mouth daily., Disp: , Rfl:    No Known Allergies    The patient states she uses none for birth control. Last LMP was Patient's last menstrual period was 10/08/2019.. Negative for Dysmenorrhea Negative for: breast discharge, breast lump(s), breast pain and breast  self exam. Associated symptoms include abnormal vaginal bleeding. Pertinent negatives include abnormal bleeding (hematology), anxiety, decreased libido, depression, difficulty falling sleep, dyspareunia, history of infertility, nocturia, sexual dysfunction, sleep disturbances, urinary incontinence, urinary urgency, vaginal discharge and vaginal itching. Diet regular.The patient states her exercise level is  intermittent.   . The patient's tobacco use is:  Social History   Tobacco Use  Smoking Status Never Smoker  Smokeless Tobacco Never Used  . She has been exposed to passive smoke. The patient's alcohol use is:  Social History   Substance and Sexual Activity  Alcohol Use No    Review of Systems  Constitutional: Negative.   HENT: Negative.   Eyes: Negative.   Respiratory: Negative.   Cardiovascular: Negative.   Endocrine: Negative.   Genitourinary: Negative.   Musculoskeletal: Negative.        She c/o sharp pains in her right hand, she is right-handed. She also feels this sensation in her feet on occasion. ocurs in her feet when driving.   Skin: Negative.   Allergic/Immunologic: Negative.   Neurological: Negative.   Hematological: Negative.   Psychiatric/Behavioral: Negative.      Today's Vitals   10/15/19 1000  BP: 112/70  Pulse: 80  Temp: 98.5 F (36.9 C)  TempSrc: Oral  Weight: 209 lb 12.8 oz (95.2 kg)  Height:  5' 4" (1.626 m)   Body mass index is 36.01 kg/m.   Objective:  Physical Exam Vitals and nursing note reviewed.  Constitutional:      Appearance: Normal appearance. She is obese.  HENT:     Head: Normocephalic and atraumatic.     Right Ear: Tympanic membrane, ear canal and external ear normal.     Left Ear: Tympanic membrane, ear canal and external ear normal.     Nose:     Comments: Deferred, masked    Mouth/Throat:     Comments: Deferred, masked Eyes:     Extraocular Movements: Extraocular movements intact.     Conjunctiva/sclera: Conjunctivae  normal.     Pupils: Pupils are equal, round, and reactive to light.  Cardiovascular:     Rate and Rhythm: Normal rate and regular rhythm.     Pulses: Normal pulses.     Heart sounds: Normal heart sounds.  Pulmonary:     Effort: Pulmonary effort is normal.     Breath sounds: Normal breath sounds.  Abdominal:     General: Bowel sounds are normal.     Palpations: Abdomen is soft.     Comments: Obese, soft.   Genitourinary:    Comments: deferred Musculoskeletal:        General: Normal range of motion.     Right wrist: No tenderness or bony tenderness.     Cervical back: Normal range of motion and neck supple.     Comments: Neg   Skin:    General: Skin is warm and dry.  Neurological:     General: No focal deficit present.     Mental Status: She is alert and oriented to person, place, and time.  Psychiatric:        Mood and Affect: Mood normal.        Behavior: Behavior normal.         Assessment And Plan:     1. Routine general medical examination at health care facility  A full exam was performed.  Importance of monthly self breast exams was discussed with the patient. PATIENT IS ADVISED TO GET 30-45 MINUTES REGULAR EXERCISE NO LESS THAN FOUR TO FIVE DAYS PER WEEK - BOTH WEIGHTBEARING EXERCISES AND AEROBIC ARE RECOMMENDED.  SHE IS ADVISED TO FOLLOW A HEALTHY DIET WITH AT LEAST SIX FRUITS/VEGGIES PER DAY, DECREASE INTAKE OF RED MEAT, AND TO INCREASE FISH INTAKE TO TWO DAYS PER WEEK.  MEATS/FISH SHOULD NOT BE FRIED, BAKED OR BROILED IS PREFERABLE.  I SUGGEST WEARING SPF 50 SUNSCREEN ON EXPOSED PARTS AND ESPECIALLY WHEN IN THE DIRECT SUNLIGHT FOR AN EXTENDED PERIOD OF TIME.  PLEASE AVOID FAST FOOD RESTAURANTS AND INCREASE YOUR WATER INTAKE.   - CMP14+EGFR - CBC - Lipid panel - Hemoglobin A1c - Vitamin B12 - POCT Urinalysis Dipstick (81002)  2. Right hand pain  She was given rx wrist splint to wear while working. She will let me know if her sx persist.   3.  Paresthesia  She has RUE paresthesias. Her sx are suggestive of possible CTS. I will check vitamin B12 level today. TSH was normal Jan 2020.   4. Class 2 severe obesity due to excess calories with serious comorbidity and body mass index (BMI) of 36.0 to 36.9 in adult [facility])  She is encouraged to strive for BMI less than 30 to decrease cardiac risk. She is encouraged to incorporate more exercise into her daily routine, aiming for at least 150 minutes per week.   5. Need  for vaccination  - Tdap vaccine greater than or equal to 7yo IM  Maximino Greenland, MD    THE PATIENT IS ENCOURAGED TO PRACTICE SOCIAL DISTANCING DUE TO THE COVID-19 PANDEMIC.

## 2019-10-18 ENCOUNTER — Encounter: Payer: Self-pay | Admitting: Internal Medicine

## 2019-10-19 ENCOUNTER — Other Ambulatory Visit: Payer: Self-pay | Admitting: Obstetrics and Gynecology

## 2019-10-22 ENCOUNTER — Telehealth: Payer: Self-pay

## 2019-10-22 ENCOUNTER — Ambulatory Visit: Payer: 59 | Admitting: Internal Medicine

## 2019-10-22 NOTE — Telephone Encounter (Signed)
The pt was notified that she needed to stop by the lab the day of her visit.  The pt said that she will try to come today if not on Monday to get her lab work done.

## 2019-10-23 LAB — VITAMIN B12: Vitamin B-12: 1319 pg/mL — ABNORMAL HIGH (ref 232–1245)

## 2019-10-23 LAB — LIPID PANEL
Chol/HDL Ratio: 3 ratio (ref 0.0–4.4)
Cholesterol, Total: 196 mg/dL (ref 100–199)
HDL: 65 mg/dL (ref >39)
LDL Chol Calc (NIH): 111 mg/dL — ABNORMAL HIGH (ref 0–99)
Triglycerides: 114 mg/dL (ref 0–149)
VLDL Cholesterol Cal: 20 mg/dL (ref 5–40)

## 2019-10-23 LAB — CMP14+EGFR
ALT: 13 IU/L (ref 0–32)
AST: 20 IU/L (ref 0–40)
Albumin/Globulin Ratio: 1.7 (ref 1.2–2.2)
Albumin: 4.2 g/dL (ref 3.8–4.8)
Alkaline Phosphatase: 129 IU/L — ABNORMAL HIGH (ref 39–117)
BUN/Creatinine Ratio: 14 (ref 9–23)
BUN: 11 mg/dL (ref 6–24)
Bilirubin Total: 0.2 mg/dL (ref 0.0–1.2)
CO2: 24 mmol/L (ref 20–29)
Calcium: 9.9 mg/dL (ref 8.7–10.2)
Chloride: 105 mmol/L (ref 96–106)
Creatinine, Ser: 0.8 mg/dL (ref 0.57–1.00)
GFR calc Af Amer: 102 mL/min/{1.73_m2} (ref >59)
GFR calc non Af Amer: 89 mL/min/{1.73_m2} (ref >59)
Globulin, Total: 2.5 g/dL (ref 1.5–4.5)
Glucose: 89 mg/dL (ref 65–99)
Potassium: 4.4 mmol/L (ref 3.5–5.2)
Sodium: 141 mmol/L (ref 134–144)
Total Protein: 6.7 g/dL (ref 6.0–8.5)

## 2019-10-23 LAB — CBC
Hematocrit: 35.4 % (ref 34.0–46.6)
Hemoglobin: 11.1 g/dL (ref 11.1–15.9)
MCH: 24.8 pg — ABNORMAL LOW (ref 26.6–33.0)
MCHC: 31.4 g/dL — ABNORMAL LOW (ref 31.5–35.7)
MCV: 79 fL (ref 79–97)
Platelets: 413 10*3/uL (ref 150–450)
RBC: 4.48 x10E6/uL (ref 3.77–5.28)
RDW: 17.5 % — ABNORMAL HIGH (ref 11.7–15.4)
WBC: 8.4 10*3/uL (ref 3.4–10.8)

## 2019-10-23 LAB — HEMOGLOBIN A1C
Est. average glucose Bld gHb Est-mCnc: 114 mg/dL
Hgb A1c MFr Bld: 5.6 % (ref 4.8–5.6)

## 2019-10-28 ENCOUNTER — Encounter: Payer: Self-pay | Admitting: Nurse Practitioner

## 2019-10-28 ENCOUNTER — Other Ambulatory Visit: Payer: Self-pay

## 2019-10-28 ENCOUNTER — Ambulatory Visit (INDEPENDENT_AMBULATORY_CARE_PROVIDER_SITE_OTHER): Payer: 59 | Admitting: Nurse Practitioner

## 2019-10-28 VITALS — BP 120/70 | HR 84 | Temp 98.4°F | Ht 64.0 in | Wt 209.4 lb

## 2019-10-28 DIAGNOSIS — L299 Pruritus, unspecified: Secondary | ICD-10-CM | POA: Diagnosis not present

## 2019-10-28 DIAGNOSIS — R5383 Other fatigue: Secondary | ICD-10-CM | POA: Diagnosis not present

## 2019-10-28 NOTE — Progress Notes (Signed)
This visit occurred during the SARS-CoV-2 public health emergency.  Safety protocols were in place, including screening questions prior to the visit, additional usage of staff PPE, and extensive cleaning of exam room while observing appropriate contact time as indicated for disinfecting solutions.  Subjective:     Patient ID: Tammie Williams , female    DOB: [birth year] , 49 y.o.   MRN: [id number]   Chief Complaint  Patient presents with  . bloodwork    patient wants to be tested for lyme disease. she has been having some stinging and burning and she feels like things are crawling. she lived in this apartment and was told she had mites     HPI  She is here today due to continued pruritis,   She has now moved out of the apartment approximately 1 month, she had water moisture.  She has been to the dermatology and is being tested for soaps.  She is concerned about having   She had been stressed, overly tired.      Past Medical History:  Diagnosis Date  . Anemia   . Anemia   . Anxiety   . Asthma   . GERD (gastroesophageal reflux disease) 01/01/2018  . Near syncope 01/01/2018  . Palpitations 01/01/2018  . Sickle cell trait ([facility])   . Vitamin D deficiency      Family History  Problem Relation Age of Onset  . Healthy Mother   . Healthy Father   . CAD Sister   . Stroke Maternal Grandmother      Current Outpatient Medications:  .  cetirizine (ZYRTEC) 10 MG tablet, Take 10 mg by mouth daily., Disp: , Rfl:  .  citalopram (CELEXA) 10 MG tablet, Take 1 tablet (10 mg total) by mouth daily., Disp: 30 tablet, Rfl: 2 .  Fe Fum-Fe Poly-Vit C-Lactobac (FUSION) 65-65-25-30 MG CAPS, Take 1 capsule by mouth daily., Disp: 30 capsule, Rfl: 5 .  hydrocortisone cream 0.5 %, Apply 1 application topically 2 (two) times daily., Disp: , Rfl:  .  hydrOXYzine (ATARAX/VISTARIL) 50 MG tablet, Take 1-2 tablets twice daily for itching., Disp: 30 tablet, Rfl: 0   No Known Allergies   Review of Systems   Constitutional: Negative.   Respiratory: Negative.   Cardiovascular: Negative.  Negative for chest pain, palpitations and leg swelling.  Neurological: Negative for dizziness and headaches.     Today's Vitals   10/28/19 0850  BP: 120/70  Pulse: 84  Temp: 98.4 F (36.9 C)  TempSrc: Oral  Weight: 209 lb 6.4 oz (95 kg)  Height: 5\' 4"  (1.626 m)  PainSc: 0-No pain   Body mass index is 35.94 kg/m.   Objective:  Physical Exam Constitutional:      General: She is not in acute distress.    Appearance: Normal appearance.  Cardiovascular:     Rate and Rhythm: Normal rate and regular rhythm.     Pulses: Normal pulses.     Heart sounds: Normal heart sounds. No murmur.  Pulmonary:     Effort: Pulmonary effort is normal. No respiratory distress.     Breath sounds: Normal breath sounds.  Skin:    General: Skin is warm.     Capillary Refill: Capillary refill takes less than 2 seconds.     Comments: Several hypopigmented pinpoint areas to lower right abdomen and lower extremities.    Neurological:     General: No focal deficit present.     Mental Status: She is alert and oriented to person,  place, and time.     Cranial Nerves: No cranial nerve deficit.  Psychiatric:        Mood and Affect: Mood normal.        Thought Content: Thought content normal.        Judgment: Judgment normal.     Comments: tearful         Assessment And Plan:     1. Pruritus  Continued pruritis she is being seen at [facility] today, she is concerned she has magellon's disease. I will check her for lyme as well   Pending her results and visit with dermatology I will refer her to neurology  Few hypopigmented areas to her abdomen and legs.    2. Fatigue, unspecified type  Initially had when all of these symptoms started   Will check for lyme   Minette Brine, FNP    THE PATIENT IS ENCOURAGED TO PRACTICE SOCIAL DISTANCING DUE TO THE COVID-19 PANDEMIC.

## 2019-11-09 ENCOUNTER — Encounter: Payer: Self-pay | Admitting: Nurse Practitioner

## 2019-11-10 ENCOUNTER — Other Ambulatory Visit: Payer: Self-pay | Admitting: Nurse Practitioner

## 2019-11-10 DIAGNOSIS — F411 Generalized anxiety disorder: Secondary | ICD-10-CM

## 2019-11-10 MED ORDER — ALPRAZOLAM 0.25 MG PO TABS: 0.2500 mg | ORAL_TABLET | Freq: Three times a day (TID) | ORAL | 0 refills | Status: DC | PRN

## 2019-11-11 ENCOUNTER — Other Ambulatory Visit: Payer: Self-pay | Admitting: Nurse Practitioner

## 2019-11-11 DIAGNOSIS — L299 Pruritus, unspecified: Secondary | ICD-10-CM

## 2019-11-18 ENCOUNTER — Encounter: Payer: Self-pay | Admitting: Nurse Practitioner

## 2019-11-18 NOTE — Telephone Encounter (Signed)
Have her to go to her appt with dermatology first and see what their thoughts are before we see her here at the office.

## 2019-11-19 LAB — LYME AB/WESTERN BLOT REFLEX
LYME DISEASE AB, QUANT, IGM: <0.8 index (ref 0.00–0.79)
Lyme IgG/IgM Ab: <0.91 {ISR} (ref 0.00–0.90)

## 2019-11-19 LAB — SPECIMEN STATUS REPORT

## 2019-11-24 ENCOUNTER — Encounter: Payer: Self-pay | Admitting: Nurse Practitioner

## 2019-11-26 ENCOUNTER — Encounter: Payer: Self-pay | Admitting: Nurse Practitioner

## 2019-11-26 ENCOUNTER — Telehealth (INDEPENDENT_AMBULATORY_CARE_PROVIDER_SITE_OTHER): Payer: 59 | Admitting: Nurse Practitioner

## 2019-11-26 ENCOUNTER — Telehealth: Payer: Self-pay

## 2019-11-26 ENCOUNTER — Other Ambulatory Visit: Payer: Self-pay

## 2019-11-26 VITALS — Temp 97.8°F | Ht 64.0 in | Wt 208.0 lb

## 2019-11-26 DIAGNOSIS — U071 COVID-19: Secondary | ICD-10-CM

## 2019-11-26 DIAGNOSIS — R0602 Shortness of breath: Secondary | ICD-10-CM

## 2019-11-26 DIAGNOSIS — R05 Cough: Secondary | ICD-10-CM

## 2019-11-26 DIAGNOSIS — R059 Cough, unspecified: Secondary | ICD-10-CM

## 2019-11-26 MED ORDER — HYDROCODONE-HOMATROPINE 5-1.5 MG/5ML PO SYRP: 5.0000 mL | ORAL_SOLUTION | Freq: Four times a day (QID) | ORAL | 0 refills | Status: DC | PRN

## 2019-11-26 MED ORDER — AZITHROMYCIN 250 MG PO TABS: ORAL_TABLET | ORAL | 0 refills | Status: AC

## 2019-11-26 NOTE — Telephone Encounter (Signed)
Called patient to update EMAR for VV no answer will call back later

## 2019-11-26 NOTE — Progress Notes (Signed)
Virtual Visit via Video   This visit type was conducted due to national recommendations for restrictions regarding the COVID-19 Pandemic (e.g. social distancing) in an effort to limit this patient's exposure and mitigate transmission in our community.  Due to her co-morbid illnesses, this patient is at least at moderate risk for complications without adequate follow up.  This format is felt to be most appropriate for this patient at this time.  All issues noted in this document were discussed and addressed.  A limited physical exam was performed with this format.    This visit type was conducted due to national recommendations for restrictions regarding the COVID-19 Pandemic (e.g. social distancing) in an effort to limit this patient's exposure and mitigate transmission in our community.  Patients identity confirmed using two different identifiers.  This format is felt to be most appropriate for this patient at this time.  All issues noted in this document were discussed and addressed.  No physical exam was performed (except for noted visual exam findings with Video Visits).    Date:  11/26/2019   ID:  Tammie Williams, DOB 05-24-1971, MRN [id number]  Patient Location:  Home - spoke with [name]  Provider location:   Office    Chief Complaint:  covid exposure  History of Present Illness:    Tammie Williams is a 49 y.o. female who presents via video conferencing for a telehealth visit today.    The patient does have symptoms concerning for COVID-19 infection (fever, chills, cough, or new shortness of breath).   She was exposed to a friend with covid.  She was tested on Friday 11/20/2019 due to the exposure.  She began with symptoms on Wednesday (felt overwhelming fatigue), on Saturday felt much worse. Denies fever.  She did have chills at first.  She does report a cough, feels like her whole chest and back hurts.  She reports having mild symptoms of shortness of breath.  Called her  nurse line on Sunday and advised to put the steam on.  Also, has headaches.  She had been taking mucinex for the headache because she thought it was a sinus infection.      Past Medical History:  Diagnosis Date  . Anemia   . Anemia   . Anxiety   . Asthma   . GERD (gastroesophageal reflux disease) 01/01/2018  . Near syncope 01/01/2018  . Palpitations 01/01/2018  . Sickle cell trait ([place])   . Vitamin D deficiency    Past Surgical History:  Procedure Laterality Date  . CESAREAN SECTION    . WISDOM TOOTH EXTRACTION       No outpatient medications have been marked as taking for the 11/26/19 encounter (Telemedicine) with Minette Brine, FNP.     Allergies:   Patient has no known allergies.   Social History   Tobacco Use  . Smoking status: Never Smoker  . Smokeless tobacco: Never Used  Substance Use Topics  . Alcohol use: No  . Drug use: No     Family Hx: The patient's family history includes CAD in her sister; Healthy in her father and mother; Stroke in her maternal grandmother.  ROS:   Please see the history of present illness.    Review of Systems  Constitutional: Positive for malaise/fatigue.  Respiratory: Positive for cough and shortness of breath (mild).   Cardiovascular: Negative.  Negative for chest pain.  Neurological: Positive for headaches (improved). Negative for dizziness.  Psychiatric/Behavioral: Negative.     All  other systems reviewed and are negative.   Labs/Other Tests and Data Reviewed:    Recent Labs: 10/22/2019: ALT 13; BUN 11; Creatinine, Ser 0.80; Hemoglobin 11.1; Platelets 413; Potassium 4.4; Sodium 141   Recent Lipid Panel Lab Results  Component Value Date/Time   CHOL 196 10/22/2019 05:16 PM   TRIG 114 10/22/2019 05:16 PM   HDL 65 10/22/2019 05:16 PM   CHOLHDL 3.0 10/22/2019 05:16 PM   LDLCALC 111 (H) 10/22/2019 05:16 PM    Wt Readings from Last 3 Encounters:  11/26/19 208 lb (94.3 kg)  10/28/19 209 lb 6.4 oz (95 kg)  10/15/19 209 lb 12.8  oz (95.2 kg)     Exam:    Vital Signs:  Temp 97.8 F (36.6 C)   Ht 5\' 4"  (1.626 m)   Wt 208 lb (94.3 kg)   BMI 35.70 kg/m     Physical Exam  Constitutional: She is oriented to person, place, and time and well-developed, well-nourished, and in no distress. No distress.  Pulmonary/Chest: No respiratory distress.  Neurological: She is alert and oriented to person, place, and time.  Psychiatric: Mood, memory, affect and judgment normal.    ASSESSMENT & PLAN:    1. COVID-19  Diagnosed on 11/18/2019  She was exposed to a friend who was sick a few days prior  She is to remain out of work until march 3 unless she is feeling better by Sunday February 28th - azithromycin (ZITHROMAX) 250 MG tablet; Take 2 tablets (500 mg) on  Day 1,  followed by 1 tablet (250 mg) once daily on Days 2 through 5.  Dispense: 6 each; Refill: 0 - HYDROcodone-homatropine (HYDROMET) 5-1.5 MG/5ML syrup; Take 5 mLs by mouth every 6 (six) hours as needed.  Dispense: 120 mL; Refill: 0  2. Cough  Persistent cough at times, will treat with hydromet. Advised to avoid driving or operating heavy machinery when taking  3. Shortness of breath  Mild shortness of breath reported  Will have Remote Health to go for a visit    COVID-19 Education: The signs and symptoms of COVID-19 were discussed with the patient and how to seek care for testing (follow up with PCP or arrange E-visit).  The importance of social distancing was discussed today.  Patient Risk:   After full review of this patients clinical status, I feel that they are at least moderate risk at this time.  Time:   Today, I have spent 12.50 minutes/ seconds with the patient with telehealth technology discussing above diagnoses.     Medication Adjustments/Labs and Tests Ordered: Current medicines are reviewed at length with the patient today.  Concerns regarding medicines are outlined above.   Tests Ordered: No orders of the defined types were placed  in this encounter.   Medication Changes: Meds ordered this encounter  Medications  . azithromycin (ZITHROMAX) 250 MG tablet    Sig: Take 2 tablets (500 mg) on  Day 1,  followed by 1 tablet (250 mg) once daily on Days 2 through 5.    Dispense:  6 each    Refill:  0  . HYDROcodone-homatropine (HYDROMET) 5-1.5 MG/5ML syrup    Sig: Take 5 mLs by mouth every 6 (six) hours as needed.    Dispense:  120 mL    Refill:  0    Disposition:  Follow up prn  Signed, March 02, FNP

## 2019-11-27 ENCOUNTER — Telehealth: Payer: Self-pay | Admitting: Nurse Practitioner

## 2019-11-27 NOTE — Telephone Encounter (Signed)
Called to discuss with Dow Adolph about Covid symptoms and the use of bamlanivimab, a monoclonal antibody infusion for those with mild to moderate Covid symptoms and at a high risk of hospitalization.     Pt is qualified for this infusion at the [facility] infusion center due to co-morbid conditions (BMI 38) and/or a member of an at-risk group.   Per chart review patient symptoms began on 11/25/19. Last eligible date for infusion is 12/03/19.   Unable to reach patient. Voicemail left and Mychart message sent.    Patient Active Problem List   Diagnosis Date Noted  . GERD (gastroesophageal reflux disease) 01/01/2018  . Palpitations 01/01/2018  . Near syncope 01/01/2018  . GAD (generalized anxiety disorder) 04/24/2011    Willette Alma, AGPCNP-BC Pager: [phone] Amion: N. Cousar

## 2019-11-28 ENCOUNTER — Encounter: Payer: Self-pay | Admitting: Nurse Practitioner

## 2019-11-30 ENCOUNTER — Encounter (HOSPITAL_COMMUNITY): Payer: Self-pay | Admitting: *Deleted

## 2019-11-30 ENCOUNTER — Emergency Department (HOSPITAL_COMMUNITY): Payer: 59

## 2019-11-30 ENCOUNTER — Other Ambulatory Visit: Payer: Self-pay

## 2019-11-30 ENCOUNTER — Emergency Department (HOSPITAL_COMMUNITY)
Admission: EM | Admit: 2019-11-30 | Discharge: 2019-11-30 | Disposition: A | Discharge status: Home / Self Care | Payer: 59 | Attending: Emergency Medicine | Admitting: Emergency Medicine

## 2019-11-30 DIAGNOSIS — R1030 Lower abdominal pain, unspecified: Secondary | ICD-10-CM | POA: Insufficient documentation

## 2019-11-30 DIAGNOSIS — K5900 Constipation, unspecified: Secondary | ICD-10-CM

## 2019-11-30 DIAGNOSIS — U071 COVID-19: Secondary | ICD-10-CM | POA: Diagnosis present

## 2019-11-30 DIAGNOSIS — R112 Nausea with vomiting, unspecified: Secondary | ICD-10-CM

## 2019-11-30 DIAGNOSIS — R55 Syncope and collapse: Secondary | ICD-10-CM | POA: Diagnosis not present

## 2019-11-30 LAB — COMPREHENSIVE METABOLIC PANEL
ALT: 14 U/L (ref 0–44)
AST: 20 U/L (ref 15–41)
Albumin: 3.9 g/dL (ref 3.5–5.0)
Alkaline Phosphatase: 91 U/L (ref 38–126)
Anion gap: 9 (ref 5–15)
BUN: 16 mg/dL (ref 6–20)
CO2: 26 mmol/L (ref 22–32)
Calcium: 9.2 mg/dL (ref 8.9–10.3)
Chloride: 106 mmol/L (ref 98–111)
Creatinine, Ser: 0.72 mg/dL (ref 0.44–1.00)
GFR calc Af Amer: >60 mL/min (ref >60)
GFR calc non Af Amer: >60 mL/min (ref >60)
Glucose, Bld: 88 mg/dL (ref 70–99)
Potassium: 3.7 mmol/L (ref 3.5–5.1)
Sodium: 141 mmol/L (ref 135–145)
Total Bilirubin: 0.3 mg/dL (ref 0.3–1.2)
Total Protein: 7.2 g/dL (ref 6.5–8.1)

## 2019-11-30 LAB — CBC WITH DIFFERENTIAL/PLATELET
Abs Immature Granulocytes: 0.06 10*3/uL (ref 0.00–0.07)
Basophils Absolute: 0 10*3/uL (ref 0.0–0.1)
Basophils Relative: 0 %
Eosinophils Absolute: 0 10*3/uL (ref 0.0–0.5)
Eosinophils Relative: 0 %
HCT: 35.9 % — ABNORMAL LOW (ref 36.0–46.0)
Hemoglobin: 11.3 g/dL — ABNORMAL LOW (ref 12.0–15.0)
Immature Granulocytes: 0 %
Lymphocytes Relative: 22 %
Lymphs Abs: 3.3 10*3/uL (ref 0.7–4.0)
MCH: 24.9 pg — ABNORMAL LOW (ref 26.0–34.0)
MCHC: 31.5 g/dL (ref 30.0–36.0)
MCV: 79.1 fL — ABNORMAL LOW (ref 80.0–100.0)
Monocytes Absolute: 1.3 10*3/uL — ABNORMAL HIGH (ref 0.1–1.0)
Monocytes Relative: 9 %
Neutro Abs: 10.6 10*3/uL — ABNORMAL HIGH (ref 1.7–7.7)
Neutrophils Relative %: 69 %
Platelets: 399 10*3/uL (ref 150–400)
RBC: 4.54 MIL/uL (ref 3.87–5.11)
RDW: 17.4 % — ABNORMAL HIGH (ref 11.5–15.5)
WBC: 15.3 10*3/uL — ABNORMAL HIGH (ref 4.0–10.5)
nRBC: 0 % (ref 0.0–0.2)

## 2019-11-30 LAB — I-STAT BETA HCG BLOOD, ED (MC, WL, AP ONLY): I-stat hCG, quantitative: <5 m[IU]/mL (ref <5)

## 2019-11-30 LAB — LIPASE, BLOOD: Lipase: 18 U/L (ref 11–51)

## 2019-11-30 MED ORDER — SODIUM CHLORIDE 0.9 % IV BOLUS
1000.0000 mL | Freq: Once | INTRAVENOUS | Status: AC
  Administered 2019-11-30: 1000 mL via INTRAVENOUS

## 2019-11-30 MED ORDER — ONDANSETRON 4 MG PO TBDP: 4.0000 mg | ORAL_TABLET | Freq: Three times a day (TID) | ORAL | 0 refills | Status: DC | PRN

## 2019-11-30 MED ORDER — SENNOSIDES-DOCUSATE SODIUM 8.6-50 MG PO TABS: 1.0000 | ORAL_TABLET | Freq: Every evening | ORAL | 0 refills | Status: DC | PRN

## 2019-11-30 MED ORDER — SODIUM CHLORIDE 0.9% FLUSH: 3.0000 mL | Freq: Once | INTRAVENOUS | Status: DC

## 2019-11-30 NOTE — ED Provider Notes (Signed)
Emergency Department Provider Note   I have reviewed the triage vital signs and the nursing notes.   HISTORY  Chief Complaint Cough, Covid Positive 11/22/19, and Sickle Cell Pain Crisis   HPI Tammie Williams is a 49 y.o. female with past medical history reviewed below including sickle cell trait presents to the emergency department with constipation, lower abdominal pain, nausea/lightheadedness.  Patient was diagnosed with COVID-19 on 2/21.  She feels like her symptoms overall are resolving but she has developed constipation over the past several days.  She took some laxative at home and suddenly began having multiple hard bowel movements.  She had some pain with bowel movements which cause some lightheadedness, nausea.  She had approximately 4 episodes of this and felt lightheaded.  Her son called EMS who advised that she be evaluated in the emergency department.  She did not experience chest pain or heart palpitations.  She did have 1 episode of vomiting.  At this point she is having lower back and abdomen pain.  No UTI symptoms.  No vaginal bleeding or discharge.   Past Medical History:  Diagnosis Date  . Anemia   . Anemia   . Anxiety   . Asthma   . GERD (gastroesophageal reflux disease) 01/01/2018  . Near syncope 01/01/2018  . Palpitations 01/01/2018  . Sickle cell trait (HCC)   . Vitamin D deficiency     Patient Active Problem List   Diagnosis Date Noted  . GERD (gastroesophageal reflux disease) 01/01/2018  . Palpitations 01/01/2018  . Near syncope 01/01/2018  . GAD (generalized anxiety disorder) 04/24/2011    Past Surgical History:  Procedure Laterality Date  . CESAREAN SECTION    . WISDOM TOOTH EXTRACTION      Allergies Patient has no known allergies.  Family History  Problem Relation Age of Onset  . Healthy Mother   . Healthy Father   . CAD Sister   . Stroke Maternal Grandmother     Social History Social History   Tobacco Use  . Smoking status: Never  Smoker  . Smokeless tobacco: Never Used  Substance Use Topics  . Alcohol use: No  . Drug use: No    Review of Systems  Constitutional: No fever/chills Eyes: No visual changes. ENT: No sore throat. Cardiovascular: Denies chest pain. Respiratory: Denies shortness of breath. Gastrointestinal: Lower abdominal pain. Positive nausea, vomiting.  No diarrhea. Positive constipation. Genitourinary: Negative for dysuria. Musculoskeletal: Positive for back pain. Skin: Negative for rash. Neurological: Negative for headaches, focal weakness or numbness.  10-point ROS otherwise negative.  ____________________________________________   PHYSICAL EXAM:  VITAL SIGNS: ED Triage Vitals  Enc Vitals Group     BP 11/30/19 1756 134/76     Pulse Rate 11/30/19 1756 67     Resp 11/30/19 1756 (!) 24     Temp 11/30/19 1756 98 F (36.7 C)     Temp Source 11/30/19 1756 Oral     SpO2 11/30/19 1756 100 %     Weight 11/30/19 1752 207 lb 3.7 oz (94 kg)     Height 11/30/19 1752 5\' 4"  (1.626 m)   Constitutional: Alert and oriented. Well appearing and in no acute distress. Eyes: Conjunctivae are normal. Head: Atraumatic. Nose: No congestion/rhinnorhea. Mouth/Throat: Mucous membranes are moist.  Neck: No stridor.   Cardiovascular: Normal rate, regular rhythm. Good peripheral circulation. Grossly normal heart sounds.   Respiratory: Normal respiratory effort.  No retractions. Lungs CTAB. Gastrointestinal: Soft and nontender. No distention. Musculoskeletal: No gross deformities  of extremities. Neurologic:  Normal speech and language. Skin:  Skin is warm, dry and intact. No rash noted.  ____________________________________________   LABS (all labs ordered are listed, but only abnormal results are displayed)  Labs Reviewed  CBC WITH DIFFERENTIAL/PLATELET - Abnormal; Notable for the following components:      Result Value   WBC 15.3 (*)    Hemoglobin 11.3 (*)    HCT 35.9 (*)    MCV 79.1 (*)    MCH  24.9 (*)    RDW 17.4 (*)    Neutro Abs 10.6 (*)    Monocytes Absolute 1.3 (*)    All other components within normal limits  COMPREHENSIVE METABOLIC PANEL  LIPASE, BLOOD  I-STAT BETA HCG BLOOD, ED ([facility], [facility], [facility] ONLY)   ____________________________________________  RADIOLOGY  DG Abdomen Acute W/Chest  Result Date: 11/30/2019 CLINICAL DATA:  Abdominal pain, COVID-19 positive 11/22/2019 EXAM: DG ABDOMEN ACUTE W/ 1V CHEST COMPARISON:  Chest radiograph 10/07/2017 FINDINGS: No consolidation, features of edema, pneumothorax, or effusion. The cardiomediastinal contours are unremarkable. No evidence of subdiaphragmatic free air. No high-grade obstructive bowel gas pattern. Few smooth ovoid calcifications the pelvis likely reflecting pelvic phleboliths. No other suspicious calcifications in the abdomen or pelvis. Mild bilateral hip arthrosis in minimal degenerative changes in the right shoulder. Remaining osseous structures are unremarkable. IMPRESSION: 1. No evidence of subdiaphragmatic free air. 2. Nonobstructive bowel gas pattern. 3. No acute cardiopulmonary disease. Electronically Signed   By: Lovena Le M.D.   On: 11/30/2019 20:57    ____________________________________________   PROCEDURES  Procedure(s) performed:   Procedures  None ____________________________________________   INITIAL IMPRESSION / ASSESSMENT AND PLAN / ED COURSE  Pertinent labs & imaging results that were available during my care of the patient were reviewed by me and considered in my medical decision making (see chart for details).   Patient presents to the emergency department for evaluation of lower back and abdomen pain.  She began having bowel movements again today after constipation for several days.  She developed lightheadedness, nausea in the setting.  Her abdomen is diffusely soft and nontender.  Plan for acute abdomen x-ray to assess for an obvious abnormal bowel gas pattern but low suspicion for partial  bowel obstruction or other acute surgical process in the abdomen.  Triage note documents that she has sickle cell disease which is not true.  She has sickle cell trait.  Covid symptoms are resolving.  I did discuss that the infusion center had been trying to contact her about monoclonal therapy and that she could get transfusions until 3/4.  She has their contact number and will be reaching out to them by phone.   Labs and imaging reviewed. Continue constipation mgmt at home. Patient feeling improved and mgmt of symptoms in the ED. Will reach out to MAB infusion clinic to see if patient still meets criteria for MAB infusion for COVID. Patient without CP or SOB symptoms. Discussed ED return precautions.  ____________________________________________  FINAL CLINICAL IMPRESSION(S) / ED DIAGNOSES  Final diagnoses:  Near syncope  Constipation, unspecified constipation type  Non-intractable vomiting with nausea, unspecified vomiting type     MEDICATIONS GIVEN DURING THIS VISIT:  Medications  sodium chloride 0.9 % bolus 1,000 mL (0 mLs Intravenous Stopped 11/30/19 2222)     NEW OUTPATIENT MEDICATIONS STARTED DURING THIS VISIT:  Discharge Medication List as of 11/30/2019  9:55 PM    START taking these medications   Details  senna-docusate (SENOKOT-S) 8.6-50 MG tablet Take 1  tablet by mouth at bedtime as needed for mild constipation or moderate constipation., Starting Mon 11/30/2019, Print        Note:  This document was prepared using Dragon voice recognition software and may include unintentional dictation errors.  Alona Bene, MD, [facility]    Yohana Bartha, Arlyss Repress, MD 12/01/19 1257

## 2019-11-30 NOTE — Discharge Instructions (Signed)
You were seen in the emergency department today with nearly passing out along with constipation and nausea with vomiting.  I am prescribing medication for constipation as well as nausea.  If you develop worsening abdominal pain he should return to the emergency department for reevaluation and possible CT scan of the abdomen.  I contacted the infusion center to asked them to reach out to you again.  You should also try to call them tomorrow if you are interested in receiving the monoclonal therapy for COVID-19.

## 2019-11-30 NOTE — ED Notes (Signed)
RN attempted to start an IV and obtain blood. Patient stated she is a hard stick. Kaitlyn, RN attempted as well and was also unable to obtain IV and blood

## 2019-11-30 NOTE — ED Triage Notes (Addendum)
BIB EMS, pt Covid + 11/22/19, symptoms started on 11/19/19 presents with cough. Pt is still taken prescribed antibiotics. Pt has SCC with pain in back. 140/76-68-100% RA

## 2019-12-01 ENCOUNTER — Telehealth: Payer: Self-pay | Admitting: Nurse Practitioner

## 2019-12-01 ENCOUNTER — Telehealth: Payer: Self-pay | Admitting: Internal Medicine

## 2019-12-01 ENCOUNTER — Encounter: Payer: Self-pay | Admitting: Nurse Practitioner

## 2019-12-01 DIAGNOSIS — R21 Rash and other nonspecific skin eruption: Secondary | ICD-10-CM

## 2019-12-01 DIAGNOSIS — L299 Pruritus, unspecified: Secondary | ICD-10-CM

## 2019-12-01 NOTE — Telephone Encounter (Signed)
She took smooth move to help her bowel movements.  She did speak with them today and feels much better.  She has started the second round of azithromycin and prednisone. She is also taking zofran for nausea from [facility]. She was given an albuterol. She took codeine with the antibiotic and she was vomiting water.  She only took one dose.  She was advised to take mucinex.  She would feels she is still somewhat tired and would like to gradually get back to work.  She is requesting a new dermatologist due to presistant itching and rash.

## 2019-12-01 NOTE — Telephone Encounter (Signed)
Called to discuss with Tammie Williams about Covid symptoms and the use of bamlanivimab, a monoclonal antibody infusion for those with mild to moderate Covid symptoms and at a high risk of hospitalization.    Pt does not qualify for infusion therapy as her symptoms first presented > 10 days prior to timing of infusion. She began feeling ill on 2/17. Symptoms tier reviewed as well as criteria for ending isolation. Preventative practices reviewed. Patient verbalized understanding   Patient Active Problem List   Diagnosis Date Noted  . GERD (gastroesophageal reflux disease) 01/01/2018  . Palpitations 01/01/2018  . Near syncope 01/01/2018  . GAD (generalized anxiety disorder) 04/24/2011   Cyndee Brightly, NP-C [facility]

## 2020-03-24 ENCOUNTER — Other Ambulatory Visit: Payer: Self-pay | Admitting: Internal Medicine

## 2020-03-29 ENCOUNTER — Encounter: Payer: Self-pay | Admitting: Nurse Practitioner

## 2020-03-30 ENCOUNTER — Ambulatory Visit: Payer: 59 | Admitting: Nurse Practitioner

## 2020-03-31 ENCOUNTER — Other Ambulatory Visit: Payer: Self-pay

## 2020-03-31 ENCOUNTER — Encounter: Payer: Self-pay | Admitting: Nurse Practitioner

## 2020-03-31 ENCOUNTER — Other Ambulatory Visit: Payer: Self-pay | Admitting: Nurse Practitioner

## 2020-03-31 ENCOUNTER — Ambulatory Visit (INDEPENDENT_AMBULATORY_CARE_PROVIDER_SITE_OTHER): Payer: 59 | Admitting: Nurse Practitioner

## 2020-03-31 VITALS — BP 120/72 | HR 74 | Temp 97.7°F | Ht 64.0 in | Wt 194.8 lb

## 2020-03-31 DIAGNOSIS — L299 Pruritus, unspecified: Secondary | ICD-10-CM | POA: Diagnosis not present

## 2020-03-31 DIAGNOSIS — R21 Rash and other nonspecific skin eruption: Secondary | ICD-10-CM

## 2020-03-31 DIAGNOSIS — Z1159 Encounter for screening for other viral diseases: Secondary | ICD-10-CM | POA: Diagnosis not present

## 2020-03-31 DIAGNOSIS — F321 Major depressive disorder, single episode, moderate: Secondary | ICD-10-CM | POA: Diagnosis not present

## 2020-03-31 NOTE — Progress Notes (Signed)
This visit occurred during the SARS-CoV-2 public health emergency.  Safety protocols were in place, including screening questions prior to the visit, additional usage of staff PPE, and extensive cleaning of exam room while observing appropriate contact time as indicated for disinfecting solutions.  Subjective:     Patient ID: Tammie Williams , female    DOB: 13-Dec-1970 , 49 y.o.   MRN: [id number]   Chief Complaint  Patient presents with  . itching    patient stated she has been itching all over  for months now     HPI  She continues to have itching and crawling.  She does not feel it is getting better after being given an oil from the dermatologist. She did have a biopsy done with Dr. Wallace Cullens which was negative. She has been to 2 dermatologist none did any blood work. She has had an environmental allergy test at the dermatologist.     Past Medical History:  Diagnosis Date  . Anemia   . Anemia   . Anxiety   . Asthma   . GERD (gastroesophageal reflux disease) 01/01/2018  . Near syncope 01/01/2018  . Palpitations 01/01/2018  . Sickle cell trait (HCC)   . Vitamin D deficiency      Family History  Problem Relation Age of Onset  . Healthy Mother   . Healthy Father   . CAD Sister   . Stroke Maternal Grandmother      Current Outpatient Medications:  .  acetaminophen (TYLENOL) 650 MG CR tablet, Take 650 mg by mouth every 8 (eight) hours as needed for pain., Disp: , Rfl:  .  cetirizine (ZYRTEC) 10 MG tablet, Take 10 mg by mouth daily., Disp: , Rfl:  .  citalopram (CELEXA) 10 MG tablet, TAKE 1 TABLET(10 MG) BY MOUTH DAILY, Disp: 30 tablet, Rfl: 2 .  DERMA-SMOOTHE/FS BODY 0.01 % OIL, Apply 1 application topically daily. After shower, Disp: , Rfl:  .  Fe Fum-Fe Poly-Vit C-Lactobac (FUSION) 65-65-25-30 MG CAPS, Take 1 capsule by mouth daily. (Patient taking differently: Take 1 capsule by mouth every other day. ), Disp: 30 capsule, Rfl: 5 .  hydrOXYzine (ATARAX/VISTARIL) 50 MG tablet, TAKE 1 TO  2 TABLETS BY MOUTH TWICE DAILY FOR ITCHING, Disp: 30 tablet, Rfl: 0 .  senna-docusate (SENOKOT-S) 8.6-50 MG tablet, Take 1 tablet by mouth at bedtime as needed for mild constipation or moderate constipation., Disp: 20 tablet, Rfl: 0 .  VENTOLIN HFA 108 (90 Base) MCG/ACT inhaler, Inhale 1 puff into the lungs every 4 (four) hours as needed for shortness of breath or wheezing., Disp: , Rfl:  .  ALPRAZolam (XANAX) 0.25 MG tablet, Take 1 tablet (0.25 mg total) by mouth 3 (three) times daily as needed for anxiety. (Patient not taking: Reported on 03/31/2020), Disp: 20 tablet, Rfl: 0 .  fluticasone (FLONASE) 50 MCG/ACT nasal spray, Place 2 sprays into both nostrils daily as needed for allergies or rhinitis. (Patient not taking: Reported on 03/31/2020), Disp: , Rfl:  .  guaiFENesin (MUCINEX) 600 MG 12 hr tablet, Take 600 mg by mouth 2 (two) times daily as needed for cough. (Patient not taking: Reported on 03/31/2020), Disp: , Rfl:  .  HYDROcodone-homatropine (HYDROMET) 5-1.5 MG/5ML syrup, Take 5 mLs by mouth every 6 (six) hours as needed. (Patient not taking: Reported on 11/30/2019), Disp: 120 mL, Rfl: 0 .  ibuprofen (ADVIL) 800 MG tablet, Take 800 mg by mouth every 6 (six) hours as needed for headache, pain or fever. (Patient not taking: Reported  on 03/31/2020), Disp: , Rfl:  .  MELATONIN PO, Take 10 drops by mouth at bedtime as needed (sleep). (Patient not taking: Reported on 03/31/2020), Disp: , Rfl:  .  ondansetron (ZOFRAN ODT) 4 MG disintegrating tablet, Take 1 tablet (4 mg total) by mouth every 8 (eight) hours as needed for nausea or vomiting. (Patient not taking: Reported on 03/31/2020), Disp: 20 tablet, Rfl: 0 .  OVER THE COUNTER MEDICATION, Take 1 packet by mouth daily. Emergen-C  Vitamin C & zinc (Patient not taking: Reported on 03/31/2020), Disp: , Rfl:  .  pimozide (ORAP) 2 MG tablet, Take 2 mg by mouth daily. (Patient not taking: Reported on 03/31/2020), Disp: , Rfl:  .  Pseudoephedrine-APAP-DM (DAYQUIL PO), Take 1  capsule by mouth daily as needed (congestion). (Patient not taking: Reported on 03/31/2020), Disp: , Rfl:  .  VIORELE 0.15-0.02/0.01 MG (21/5) tablet, Take 1 tablet by mouth daily. (Patient not taking: Reported on 03/31/2020), Disp: , Rfl:    No Known Allergies   Review of Systems  Constitutional: Negative.   Respiratory: Negative.  Negative for cough.   Cardiovascular: Negative.  Negative for chest pain, palpitations and leg swelling.  Neurological: Negative for dizziness and numbness.  Psychiatric/Behavioral: Negative.      Today's Vitals   03/31/20 1504  BP: 120/72  Pulse: 74  Temp: 97.7 F (36.5 C)  TempSrc: Oral  Weight: 194 lb 12.8 oz (88.4 kg)  Height: 5\' 4"  (1.626 m)  PainSc: 0-No pain   Body mass index is 33.44 kg/m.   Objective:  Physical Exam Vitals reviewed.  Constitutional:      General: She is not in acute distress.    Appearance: Normal appearance.  Cardiovascular:     Rate and Rhythm: Normal rate and regular rhythm.     Pulses: Normal pulses.     Heart sounds: Normal heart sounds. No murmur heard.   Pulmonary:     Effort: Pulmonary effort is normal. No respiratory distress.     Breath sounds: Normal breath sounds.  Skin:    General: Skin is warm.     Capillary Refill: Capillary refill takes less than 2 seconds.  Neurological:     General: No focal deficit present.     Mental Status: She is alert and oriented to person, place, and time.     Cranial Nerves: No cranial nerve deficit.  Psychiatric:        Mood and Affect: Mood normal.        Behavior: Behavior normal.        Thought Content: Thought content normal.        Judgment: Judgment normal.         Assessment And Plan:     1. Pruritus  No obvious rash - Ambulatory referral to Neurology - [place] mtn spotted fvr abs pnl(IgG+IgM)  2. Rash and nonspecific skin eruption  Scattered fine rash flesh colored  Will check [place] M[place] spotted fever  - Ambulatory referral to Neurology - [place] mtn  spotted fvr abs pnl(IgG+IgM)  3. Current moderate episode of major depressive disorder, unspecified whether recurrent (HCC)  Likely related to her issues with the chronic itching  I have given her 2 weeks off  4. Encounter for hepatitis C screening test for low risk patient  Will check Hepatitis C screening due to recent recommendations to screen all adults 18 years and older - Hepatitis C antibody   , FNP    THE PATIENT IS ENCOURAGED TO PRACTICE SOCIAL  DISTANCING DUE TO THE COVID-19 PANDEMIC.

## 2020-04-01 LAB — HEPATITIS C ANTIBODY: Hep C Virus Ab: <0.1 s/co ratio (ref 0.0–0.9)

## 2020-04-02 ENCOUNTER — Encounter: Payer: Self-pay | Admitting: Nurse Practitioner

## 2020-04-02 LAB — ROCKY MTN SPOTTED FVR ABS PNL(IGG+IGM)
RMSF IgG: NEGATIVE
RMSF IgM: 1.21 index — ABNORMAL HIGH (ref 0.00–0.89)

## 2020-04-06 ENCOUNTER — Other Ambulatory Visit: Payer: Self-pay | Admitting: Nurse Practitioner

## 2020-04-06 DIAGNOSIS — R21 Rash and other nonspecific skin eruption: Secondary | ICD-10-CM

## 2020-04-06 MED ORDER — PERMETHRIN 1 % EX LOTN: TOPICAL_LOTION | CUTANEOUS | 1 refills | Status: DC

## 2020-04-13 ENCOUNTER — Telehealth: Payer: Self-pay

## 2020-04-13 ENCOUNTER — Encounter: Payer: Self-pay | Admitting: Nurse Practitioner

## 2020-04-13 NOTE — Telephone Encounter (Signed)
Nikki from [facility] called regarding patient referral I returned her call at [phone] and left her a v/m notifying her that we would like to cancel the referral per provider. YL,RMA

## 2020-04-14 ENCOUNTER — Telehealth: Payer: Self-pay

## 2020-04-14 ENCOUNTER — Ambulatory Visit: Payer: 59 | Admitting: Internal Medicine

## 2020-04-14 NOTE — Telephone Encounter (Signed)
Spoke w/pt she will pick forms up Monday 04/18/20

## 2020-04-18 ENCOUNTER — Encounter: Payer: Self-pay | Admitting: Nurse Practitioner

## 2020-04-18 ENCOUNTER — Other Ambulatory Visit: Payer: Self-pay

## 2020-04-18 MED ORDER — CARBINOXAMINE MALEATE 6 MG PO TABS: 6.0000 mg | ORAL_TABLET | Freq: Two times a day (BID) | ORAL | 1 refills | Status: DC | PRN

## 2020-04-21 ENCOUNTER — Other Ambulatory Visit: Payer: Self-pay | Admitting: Nurse Practitioner

## 2020-04-21 ENCOUNTER — Encounter: Payer: Self-pay | Admitting: Nurse Practitioner

## 2020-05-29 ENCOUNTER — Encounter: Payer: Self-pay | Admitting: Nurse Practitioner

## 2020-06-07 ENCOUNTER — Other Ambulatory Visit: Payer: Self-pay | Admitting: Obstetrics and Gynecology

## 2020-06-07 DIAGNOSIS — D500 Iron deficiency anemia secondary to blood loss (chronic): Secondary | ICD-10-CM

## 2020-06-08 ENCOUNTER — Encounter: Payer: Self-pay | Admitting: Nurse Practitioner

## 2020-06-09 ENCOUNTER — Encounter: Payer: Self-pay | Admitting: Nurse Practitioner

## 2020-06-28 ENCOUNTER — Ambulatory Visit (HOSPITAL_COMMUNITY)
Admission: RE | Admit: 2020-06-28 | Discharge: 2020-06-28 | Disposition: A | Discharge status: Home / Self Care | Payer: 59 | Source: Ambulatory Visit | Attending: Internal Medicine | Admitting: Internal Medicine

## 2020-06-28 ENCOUNTER — Other Ambulatory Visit: Payer: Self-pay

## 2020-06-28 DIAGNOSIS — D500 Iron deficiency anemia secondary to blood loss (chronic): Secondary | ICD-10-CM | POA: Insufficient documentation

## 2020-06-28 MED ORDER — SODIUM CHLORIDE 0.9 % IV SOLN
510.0000 mg | INTRAVENOUS | Status: DC
  Administered 2020-06-28: 510 mg via INTRAVENOUS
  Filled 2020-06-28: qty 510

## 2020-06-28 MED ORDER — SODIUM CHLORIDE 0.9 % IV SOLN
INTRAVENOUS | Status: DC | PRN
  Administered 2020-06-28: 250 mL via INTRAVENOUS

## 2020-06-28 NOTE — Progress Notes (Signed)
PATIENT CARE CENTER NOTE  Diagnosis: Iron deficiency anemia due to chronic blood loss (D50.0)   Provider: Geryl Rankins, MD   Procedure: Feraheme infusion    Note: Patient received Feraheme infusion via PIV. Tolerated well with no adverse reaction. Observed patient for 30 minutes post-infusion. Vital signs remained stable. Discharge instructions given. Patient to come back next week for second infusion. Alert, oriented and ambulatory at discharge.

## 2020-06-28 NOTE — Discharge Instructions (Signed)

## 2020-07-05 ENCOUNTER — Ambulatory Visit (HOSPITAL_COMMUNITY)
Admission: RE | Admit: 2020-07-05 | Discharge: 2020-07-05 | Disposition: A | Discharge status: Home / Self Care | Payer: 59 | Source: Ambulatory Visit | Attending: Internal Medicine | Admitting: Internal Medicine

## 2020-07-05 DIAGNOSIS — D500 Iron deficiency anemia secondary to blood loss (chronic): Secondary | ICD-10-CM | POA: Diagnosis not present

## 2020-07-05 MED ORDER — SODIUM CHLORIDE 0.9 % IV SOLN
INTRAVENOUS | Status: DC | PRN
  Administered 2020-07-05: 250 mL via INTRAVENOUS

## 2020-07-05 MED ORDER — SODIUM CHLORIDE 0.9 % IV SOLN
510.0000 mg | Freq: Once | INTRAVENOUS | Status: AC
  Administered 2020-07-05: 510 mg via INTRAVENOUS
  Filled 2020-07-05: qty 510

## 2020-07-05 NOTE — Progress Notes (Signed)
PATIENT CARE CENTER NOTE  Diagnosis: Iron deficiency anemia due to chronic blood loss (D50.0)   Provider: Geryl Rankins, MD   Procedure: Feraheme infusion    Note: Patient received Feraheme infusion via PIV. Tolerated well. Observed patient for 30 minutes post-infusion. Prior to discharge, patient reported feeling dizzy. Vital signs taken and BP 127/45. [facility] notified of patient's symptoms. Spoke with Dia, Charity fundraiser. Verbal order received from Dr. Earlene Plater to allow patient to eat a snack, observe patient and recheck vital signs. Patient given snack and observed for 30 minutes. BP rechecked and was 134/81. Patient continued to complain of dizziness. Per patient, she is currently on her menstrual period and has heavy bleeding. Patient believes that the dizziness may be related to bleeding. In house provider, [place] FNP, also notified of patient's symptoms. Provider advised that orthostatics vitals be taken and patient monitored. Orthostatic vitals taken and were stable. Upon reassessment, patient reported resolution of dizziness. Patient advised to contact OBGYN  if symptoms return. Discharge instructions given.  Alert, oriented and ambulatory at discharge.

## 2020-07-05 NOTE — Discharge Instructions (Signed)

## 2020-07-17 ENCOUNTER — Encounter: Payer: Self-pay | Admitting: Nurse Practitioner

## 2020-07-26 ENCOUNTER — Encounter: Payer: Self-pay | Admitting: Nurse Practitioner

## 2020-09-01 ENCOUNTER — Encounter: Payer: Self-pay | Admitting: Nurse Practitioner

## 2020-09-12 LAB — HM MAMMOGRAPHY

## 2020-09-22 ENCOUNTER — Encounter: Payer: Self-pay | Admitting: Internal Medicine

## 2020-09-25 ENCOUNTER — Other Ambulatory Visit: Payer: Self-pay | Admitting: Internal Medicine

## 2020-10-24 ENCOUNTER — Other Ambulatory Visit: Payer: Self-pay

## 2020-10-24 ENCOUNTER — Ambulatory Visit (INDEPENDENT_AMBULATORY_CARE_PROVIDER_SITE_OTHER): Payer: 59 | Admitting: Internal Medicine

## 2020-10-24 ENCOUNTER — Encounter: Payer: Self-pay | Admitting: Internal Medicine

## 2020-10-24 VITALS — BP 110/68 | HR 70 | Temp 98.2°F | Ht 63.8 in | Wt 216.6 lb

## 2020-10-24 DIAGNOSIS — F321 Major depressive disorder, single episode, moderate: Secondary | ICD-10-CM | POA: Insufficient documentation

## 2020-10-24 DIAGNOSIS — Z6836 Body mass index (BMI) 36.0-36.9, adult: Secondary | ICD-10-CM

## 2020-10-24 DIAGNOSIS — R635 Abnormal weight gain: Secondary | ICD-10-CM

## 2020-10-24 DIAGNOSIS — F411 Generalized anxiety disorder: Secondary | ICD-10-CM

## 2020-10-24 DIAGNOSIS — Z1211 Encounter for screening for malignant neoplasm of colon: Secondary | ICD-10-CM

## 2020-10-24 DIAGNOSIS — Z0000 Encounter for general adult medical examination without abnormal findings: Secondary | ICD-10-CM | POA: Diagnosis not present

## 2020-10-24 MED ORDER — CITALOPRAM HYDROBROMIDE 10 MG PO TABS: ORAL_TABLET | ORAL | 2 refills | Status: DC

## 2020-10-24 NOTE — Progress Notes (Signed)
I,Katawbba Wiggins,acting as a Education administrator for Maximino Greenland, MD.,have documented all relevant documentation on the behalf of Maximino Greenland, MD,as directed by  Maximino Greenland, MD while in the presence of Maximino Greenland, MD.  This visit occurred during the SARS-CoV-2 public health emergency.  Safety protocols were in place, including screening questions prior to the visit, additional usage of staff PPE, and extensive cleaning of exam room while observing appropriate contact time as indicated for disinfecting solutions.  Subjective:     Patient ID: Tammie Williams , female    DOB: April 04, 1971 , 50 y.o.   MRN: [id number]   Chief Complaint  Patient presents with  . Annual Exam    HPI  She is here today for a full physical examination.  She is followed by Dr. Delora Fuel for her pap smears. Her last visit was Dec 2021. She is not yet vaccinated for COVID. She is hesitant because she doesn't want to feel "sick". She does admit to having COVID Feb 2021.  She also has a [employer] form that needs to be completed. She will forward this to me later this week.     Past Medical History:  Diagnosis Date  . Anemia   . Anemia   . Anxiety   . Asthma   . GERD (gastroesophageal reflux disease) 01/01/2018  . Near syncope 01/01/2018  . Palpitations 01/01/2018  . Sickle cell trait ([place])   . Vitamin D deficiency      Family History  Problem Relation Age of Onset  . Healthy Mother   . Healthy Father   . CAD Sister   . Stroke Maternal Grandmother      Current Outpatient Medications:  .  CALCIUM PO, Take by mouth., Disp: , Rfl:  .  Carbinoxamine Maleate (RYVENT) 6 MG TABS, Take 6 mg by mouth 2 (two) times daily as needed., Disp: 120 tablet, Rfl: 1 .  Fe Fum-Fe Poly-Vit C-Lactobac (FUSION) 65-65-25-30 MG CAPS, Take 1 capsule by mouth daily. (Patient taking differently: Take 1 capsule by mouth every other day.), Disp: 30 capsule, Rfl: 5 .  MAGNESIUM PO, Take by mouth., Disp: , Rfl:  .  OVER THE  COUNTER MEDICATION, Take 1 packet by mouth daily. Emergen-C  Vitamin C & zinc, Disp: , Rfl:  .  senna-docusate (SENOKOT-S) 8.6-50 MG tablet, Take 1 tablet by mouth at bedtime as needed for mild constipation or moderate constipation., Disp: 20 tablet, Rfl: 0 .  VENTOLIN HFA 108 (90 Base) MCG/ACT inhaler, Inhale 1 puff into the lungs every 4 (four) hours as needed for shortness of breath or wheezing., Disp: , Rfl:  .  VITAMIN D PO, Take by mouth., Disp: , Rfl:  .  citalopram (CELEXA) 10 MG tablet, TAKE 1 TABLET(10 MG) BY MOUTH DAILY, Disp: 30 tablet, Rfl: 2 .  DERMA-SMOOTHE/FS BODY 0.01 % OIL, Apply 1 application topically daily. After shower (Patient not taking: Reported on 10/24/2020), Disp: , Rfl:  .  hydrOXYzine (ATARAX/VISTARIL) 50 MG tablet, TAKE 1 TO 2 TABLETS BY MOUTH TWICE DAILY FOR ITCHING (Patient not taking: Reported on 10/24/2020), Disp: 30 tablet, Rfl: 0 .  MELATONIN PO, Take 10 drops by mouth at bedtime as needed (sleep). (Patient not taking: No sig reported), Disp: , Rfl:  .  pimozide (ORAP) 2 MG tablet, Take 2 mg by mouth daily. (Patient not taking: No sig reported), Disp: , Rfl:    No Known Allergies    The patient states she uses none for birth control.  Last LMP was Patient's last menstrual period was 09/27/2020.. Negative for Dysmenorrhea. Negative for: breast discharge, breast lump(s), breast pain and breast self exam. Associated symptoms include abnormal vaginal bleeding. Pertinent negatives include abnormal bleeding (hematology), anxiety, decreased libido, depression, difficulty falling sleep, dyspareunia, history of infertility, nocturia, sexual dysfunction, sleep disturbances, urinary incontinence, urinary urgency, vaginal discharge and vaginal itching. Diet regular.The patient states her exercise level is  intermittent.  . The patient's tobacco use is:  Social History   Tobacco Use  Smoking Status Never Smoker  Smokeless Tobacco Never Used  . She has been exposed to passive  smoke. The patient's alcohol use is:  Social History   Substance and Sexual Activity  Alcohol Use No    Review of Systems  Constitutional: Negative.   HENT: Negative.   Eyes: Negative.   Respiratory: Negative.   Cardiovascular: Negative.   Gastrointestinal: Negative.   Endocrine: Negative.   Genitourinary: Negative.   Musculoskeletal: Negative.   Skin: Negative.   Allergic/Immunologic: Negative.   Neurological: Negative.   Hematological: Negative.   Psychiatric/Behavioral: Positive for dysphoric mood. The patient is nervous/anxious.      Today's Vitals   10/24/20 0955  BP: 110/68  Pulse: 70  Temp: 98.2 F (36.8 C)  TempSrc: Oral  Weight: 216 lb 9.6 oz (98.2 kg)  Height: 5' 3.8" (1.621 m)   Body mass index is 37.41 kg/m.   Wt Readings from Last 3 Encounters:  10/24/20 216 lb 9.6 oz (98.2 kg)  03/31/20 194 lb 12.8 oz (88.4 kg)  11/30/19 207 lb 3.7 oz (94 kg)    Objective:  Physical Exam Vitals and nursing note reviewed.  Constitutional:      General: She is not in acute distress.    Appearance: Normal appearance. She is well-developed. She is obese.  HENT:     Head: Normocephalic and atraumatic.     Right Ear: Hearing, tympanic membrane, ear canal and external ear normal. There is no impacted cerumen.     Left Ear: Hearing, tympanic membrane, ear canal and external ear normal. There is no impacted cerumen.     Nose:     Comments: Deferred, masked    Mouth/Throat:     Comments: Deferred, masked Eyes:     General: Lids are normal.     Extraocular Movements: Extraocular movements intact.     Conjunctiva/sclera: Conjunctivae normal.     Pupils: Pupils are equal, round, and reactive to light.  Neck:     Thyroid: No thyroid mass.     Vascular: No carotid bruit.  Cardiovascular:     Rate and Rhythm: Normal rate and regular rhythm.     Pulses: Normal pulses.     Heart sounds: Normal heart sounds. No murmur heard.   Pulmonary:     Effort: Pulmonary effort  is normal.     Breath sounds: Normal breath sounds.  Chest:  Breasts:     Tanner Score is 5.     Right: Normal.     Left: Normal.    Abdominal:     General: Abdomen is flat. Bowel sounds are normal. There is no distension.     Palpations: Abdomen is soft.     Tenderness: There is no abdominal tenderness.  Genitourinary:    Comments: Deferred Musculoskeletal:        General: No swelling. Normal range of motion.     Cervical back: Full passive range of motion without pain, normal range of motion and neck supple.  Right lower leg: No edema.     Left lower leg: No edema.  Skin:    General: Skin is warm and dry.     Capillary Refill: Capillary refill takes less than 2 seconds.  Neurological:     General: No focal deficit present.     Mental Status: She is alert and oriented to person, place, and time.     Cranial Nerves: No cranial nerve deficit.     Sensory: No sensory deficit.  Psychiatric:        Mood and Affect: Mood normal.        Behavior: Behavior normal.        Thought Content: Thought content normal.        Judgment: Judgment normal.         Assessment And Plan:     1. Routine general medical examination at health care facility Comments: A full exam was performed. Importance of monthly self breast exams was discussed with the patient. PATIENT IS ADVISED TO GET 30-45 MINUTES REGULAR EXERCISE NO LESS THAN FOUR TO FIVE DAYS PER WEEK - BOTH WEIGHTBEARING EXERCISES AND AEROBIC ARE RECOMMENDED.  PATIENT IS ADVISED TO FOLLOW A HEALTHY DIET WITH AT LEAST SIX FRUITS/VEGGIES PER DAY, DECREASE INTAKE OF RED MEAT, AND TO INCREASE FISH INTAKE TO TWO DAYS PER WEEK.  MEATS/FISH SHOULD NOT BE FRIED, BAKED OR BROILED IS PREFERABLE.  I SUGGEST WEARING SPF 50 SUNSCREEN ON EXPOSED PARTS AND ESPECIALLY WHEN IN THE DIRECT SUNLIGHT FOR AN EXTENDED PERIOD OF TIME.  PLEASE AVOID FAST FOOD RESTAURANTS AND INCREASE YOUR WATER INTAKE.  - CALCIUM PO; Take by mouth. - CBC - CMP14+EGFR - Lipid  panel - TSH  2. Current moderate episode of major depressive disorder, unspecified whether recurrent ([place]) Comments: Chronic, PHQ-9 performed. She agrees to see specialist for further medication management. Referral placed during her visit.  - Ambulatory referral to Psychiatry  3. Weight gain Comments: She was made aware of 22 pound weight gain. Importance of lifestyle changes was discussed with patient. Encouraged to avoid sodas. Advised to decrease her intake of sugary beverages and processed foods. I will check labs as listed below.  - TSH - Hemoglobin A1c  4. GAD (generalized anxiety disorder) Comments: Chronic. She will c/w citalopram for now. I will check thyroid function today.  - MAGNESIUM PO; Take by mouth. - VITAMIN D PO; Take by mouth. - Ambulatory referral to Psychiatry  5. Class 2 severe obesity due to excess calories with serious comorbidity and body mass index (BMI) of 36.0 to 36.9 in adult [facility]) Comments: She is encouraged to strive for BMI less than 30 to decrease cardiac risk. Advised to aim for at least 150 minutes of exercise per week.   6. Screen for colon cancer Comments: She agrees to GI referral for CRC screening.  - Ambulatory referral to Gastroenterology  Patient was given opportunity to ask questions. Patient verbalized understanding of the plan and was able to repeat key elements of the plan. All questions were answered to their satisfaction.  Maximino Greenland, MD   I, Maximino Greenland, MD, have reviewed all documentation for this visit. The documentation on 10/24/20 for the exam, diagnosis, procedures, and orders are all accurate and complete.  THE PATIENT IS ENCOURAGED TO PRACTICE SOCIAL DISTANCING DUE TO THE COVID-19 PANDEMIC.

## 2020-10-24 NOTE — Patient Instructions (Signed)
Health Maintenance, Female Adopting a healthy lifestyle and getting preventive care are important in promoting health and wellness. Ask your health care provider about:  The right schedule for you to have regular tests and exams.  Things you can do on your own to prevent diseases and keep yourself healthy. What should I know about diet, weight, and exercise? Eat a healthy diet  Eat a diet that includes plenty of vegetables, fruits, low-fat dairy products, and lean protein.  Do not eat a lot of foods that are high in solid fats, added sugars, or sodium.   Maintain a healthy weight Body mass index (BMI) is used to identify weight problems. It estimates body fat based on height and weight. Your health care provider can help determine your BMI and help you achieve or maintain a healthy weight. Get regular exercise Get regular exercise. This is one of the most important things you can do for your health. Most adults should:  Exercise for at least 150 minutes each week. The exercise should increase your heart rate and make you sweat (moderate-intensity exercise).  Do strengthening exercises at least twice a week. This is in addition to the moderate-intensity exercise.  Spend less time sitting. Even light physical activity can be beneficial. Watch cholesterol and blood lipids Have your blood tested for lipids and cholesterol at 48 years of age, then have this test every 5 years. Have your cholesterol levels checked more often if:  Your lipid or cholesterol levels are high.  You are older than 48 years of age.  You are at high risk for heart disease. What should I know about cancer screening? Depending on your health history and family history, you may need to have cancer screening at various ages. This may include screening for:  Breast cancer.  Cervical cancer.  Colorectal cancer.  Skin cancer.  Lung cancer. What should I know about heart disease, diabetes, and high blood  pressure? Blood pressure and heart disease  High blood pressure causes heart disease and increases the risk of stroke. This is more likely to develop in people who have high blood pressure readings, are of African descent, or are overweight.  Have your blood pressure checked: ? Every 3-5 years if you are 18-39 years of age. ? Every year if you are 40 years old or older. Diabetes Have regular diabetes screenings. This checks your fasting blood sugar level. Have the screening done:  Once every three years after age 40 if you are at a normal weight and have a low risk for diabetes.  More often and at a younger age if you are overweight or have a high risk for diabetes. What should I know about preventing infection? Hepatitis B If you have a higher risk for hepatitis B, you should be screened for this virus. Talk with your health care provider to find out if you are at risk for hepatitis B infection. Hepatitis C Testing is recommended for:  Everyone born from 1945 through 1965.  Anyone with known risk factors for hepatitis C. Sexually transmitted infections (STIs)  Get screened for STIs, including gonorrhea and chlamydia, if: ? You are sexually active and are younger than 48 years of age. ? You are older than 48 years of age and your health care provider tells you that you are at risk for this type of infection. ? Your sexual activity has changed since you were last screened, and you are at increased risk for chlamydia or gonorrhea. Ask your health care provider   if you are at risk.  Ask your health care provider about whether you are at high risk for HIV. Your health care provider may recommend a prescription medicine to help prevent HIV infection. If you choose to take medicine to prevent HIV, you should first get tested for HIV. You should then be tested every 3 months for as long as you are taking the medicine. Pregnancy  If you are about to stop having your period (premenopausal) and  you may become pregnant, seek counseling before you get pregnant.  Take 400 to 800 micrograms (mcg) of folic acid every day if you become pregnant.  Ask for birth control (contraception) if you want to prevent pregnancy. Osteoporosis and menopause Osteoporosis is a disease in which the bones lose minerals and strength with aging. This can result in bone fractures. If you are 65 years old or older, or if you are at risk for osteoporosis and fractures, ask your health care provider if you should:  Be screened for bone loss.  Take a calcium or vitamin D supplement to lower your risk of fractures.  Be given hormone replacement therapy (HRT) to treat symptoms of menopause. Follow these instructions at home: Lifestyle  Do not use any products that contain nicotine or tobacco, such as cigarettes, e-cigarettes, and chewing tobacco. If you need help quitting, ask your health care provider.  Do not use street drugs.  Do not share needles.  Ask your health care provider for help if you need support or information about quitting drugs. Alcohol use  Do not drink alcohol if: ? Your health care provider tells you not to drink. ? You are pregnant, may be pregnant, or are planning to become pregnant.  If you drink alcohol: ? Limit how much you use to 0-1 drink a day. ? Limit intake if you are breastfeeding.  Be aware of how much alcohol is in your drink. In the [place] one drink equals one 12 oz bottle of beer (355 mL), one 5 oz glass of wine (148 mL), or one 1 oz glass of hard liquor (44 mL). General instructions  Schedule regular health, dental, and eye exams.  Stay current with your vaccines.  Tell your health care provider if: ? You often feel depressed. ? You have ever been abused or do not feel safe at home. Summary  Adopting a healthy lifestyle and getting preventive care are important in promoting health and wellness.  Follow your health care provider's instructions about healthy  diet, exercising, and getting tested or screened for diseases.  Follow your health care provider's instructions on monitoring your cholesterol and blood pressure. This information is not intended to replace advice given to you by your health care provider. Make sure you discuss any questions you have with your health care provider. Document Revised: 09/10/2018 Document Reviewed: 09/10/2018 Elsevier Patient Education  2021 Else[employer] Inc.  

## 2020-10-25 LAB — CMP14+EGFR
ALT: 10 IU/L (ref 0–32)
AST: 15 IU/L (ref 0–40)
Albumin/Globulin Ratio: 1.4 (ref 1.2–2.2)
Albumin: 4 g/dL (ref 3.8–4.8)
Alkaline Phosphatase: 122 IU/L — ABNORMAL HIGH (ref 44–121)
BUN/Creatinine Ratio: 10 (ref 9–23)
BUN: 7 mg/dL (ref 6–24)
Bilirubin Total: 0.3 mg/dL (ref 0.0–1.2)
CO2: 23 mmol/L (ref 20–29)
Calcium: 9.6 mg/dL (ref 8.7–10.2)
Chloride: 110 mmol/L — ABNORMAL HIGH (ref 96–106)
Creatinine, Ser: 0.7 mg/dL (ref 0.57–1.00)
GFR calc Af Amer: 119 mL/min/{1.73_m2} (ref >59)
GFR calc non Af Amer: 104 mL/min/{1.73_m2} (ref >59)
Globulin, Total: 2.8 g/dL (ref 1.5–4.5)
Glucose: 85 mg/dL (ref 65–99)
Potassium: 4.3 mmol/L (ref 3.5–5.2)
Sodium: 143 mmol/L (ref 134–144)
Total Protein: 6.8 g/dL (ref 6.0–8.5)

## 2020-10-25 LAB — LIPID PANEL
Chol/HDL Ratio: 3.8 ratio (ref 0.0–4.4)
Cholesterol, Total: 207 mg/dL — ABNORMAL HIGH (ref 100–199)
HDL: 54 mg/dL (ref >39)
LDL Chol Calc (NIH): 130 mg/dL — ABNORMAL HIGH (ref 0–99)
Triglycerides: 130 mg/dL (ref 0–149)
VLDL Cholesterol Cal: 23 mg/dL (ref 5–40)

## 2020-10-25 LAB — CBC
Hematocrit: 38.1 % (ref 34.0–46.6)
Hemoglobin: 13 g/dL (ref 11.1–15.9)
MCH: 29.7 pg (ref 26.6–33.0)
MCHC: 34.1 g/dL (ref 31.5–35.7)
MCV: 87 fL (ref 79–97)
Platelets: 310 10*3/uL (ref 150–450)
RBC: 4.37 x10E6/uL (ref 3.77–5.28)
RDW: 12.6 % (ref 11.7–15.4)
WBC: 8.9 10*3/uL (ref 3.4–10.8)

## 2020-10-25 LAB — HEMOGLOBIN A1C
Est. average glucose Bld gHb Est-mCnc: 108 mg/dL
Hgb A1c MFr Bld: 5.4 % (ref 4.8–5.6)

## 2020-10-25 LAB — TSH: TSH: 0.524 u[IU]/mL (ref 0.450–4.500)

## 2020-10-26 ENCOUNTER — Encounter: Payer: Self-pay | Admitting: Internal Medicine

## 2020-11-02 ENCOUNTER — Encounter: Payer: Self-pay | Admitting: Internal Medicine

## 2020-11-30 ENCOUNTER — Encounter: Payer: Self-pay | Admitting: Internal Medicine

## 2020-11-30 LAB — HM COLONOSCOPY

## 2021-03-20 ENCOUNTER — Other Ambulatory Visit: Payer: Self-pay

## 2021-03-20 ENCOUNTER — Encounter (HOSPITAL_COMMUNITY): Payer: Self-pay

## 2021-03-20 ENCOUNTER — Ambulatory Visit (HOSPITAL_COMMUNITY)
Admission: EM | Admit: 2021-03-20 | Discharge: 2021-03-20 | Disposition: A | Discharge status: Home / Self Care | Payer: 59 | Attending: Urgent Care | Admitting: Urgent Care

## 2021-03-20 DIAGNOSIS — K529 Noninfective gastroenteritis and colitis, unspecified: Secondary | ICD-10-CM | POA: Insufficient documentation

## 2021-03-20 DIAGNOSIS — Z20822 Contact with and (suspected) exposure to covid-19: Secondary | ICD-10-CM | POA: Diagnosis not present

## 2021-03-20 DIAGNOSIS — R350 Frequency of micturition: Secondary | ICD-10-CM | POA: Diagnosis not present

## 2021-03-20 DIAGNOSIS — R1084 Generalized abdominal pain: Secondary | ICD-10-CM | POA: Insufficient documentation

## 2021-03-20 DIAGNOSIS — R197 Diarrhea, unspecified: Secondary | ICD-10-CM

## 2021-03-20 LAB — POCT URINALYSIS DIPSTICK, ED / UC
Bilirubin Urine: NEGATIVE
Glucose, UA: NEGATIVE mg/dL
Hgb urine dipstick: NEGATIVE
Ketones, ur: NEGATIVE mg/dL
Leukocytes,Ua: NEGATIVE
Nitrite: NEGATIVE
Protein, ur: NEGATIVE mg/dL
Specific Gravity, Urine: 1.02 (ref 1.005–1.030)
Urobilinogen, UA: 0.2 mg/dL (ref 0.0–1.0)
pH: 5.5 (ref 5.0–8.0)

## 2021-03-20 LAB — SARS CORONAVIRUS 2 (TAT 6-24 HRS): SARS Coronavirus 2: NEGATIVE

## 2021-03-20 MED ORDER — LOPERAMIDE HCL 2 MG PO CAPS: 2.0000 mg | ORAL_CAPSULE | Freq: Two times a day (BID) | ORAL | 0 refills | Status: DC | PRN

## 2021-03-20 MED ORDER — ONDANSETRON 8 MG PO TBDP: 8.0000 mg | ORAL_TABLET | Freq: Three times a day (TID) | ORAL | 0 refills | Status: DC | PRN

## 2021-03-20 NOTE — Discharge Instructions (Addendum)

## 2021-03-20 NOTE — ED Triage Notes (Signed)
Pt presents with generalized abdominal pain, diarrhea, and urinary frequency x 7 days. Reports 4 episodes of diarrhea a day. States she began feeling fatigued. Liquid IV started to help that. All symptoms are continuing.

## 2021-03-20 NOTE — ED Provider Notes (Signed)
[facility]   MRN: [id number] DOB: 1971-03-01  Subjective:   Tammie Williams is a 50 y.o. female presenting for 1 week history of persistent belly cramping, belly pain, diarrhea, urinary frequency.  Patient has had about 4 bowel movements a day all loose and watery.  Has had decreased appetite and nausea but no vomiting.  Denies chest pain, shortness of breath, cough.  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  Otherwise, denies long distance travel, recent antibiotic use, bloody stools, hospitalizations.  No history of GI issues.  No current facility-administered medications for this encounter.  Current Outpatient Medications:    loperamide (IMODIUM) 2 MG capsule, Take 1 capsule (2 mg total) by mouth 2 (two) times daily as needed for diarrhea or loose stools., Disp: 14 capsule, Rfl: 0   ondansetron (ZOFRAN-ODT) 8 MG disintegrating tablet, Take 1 tablet (8 mg total) by mouth every 8 (eight) hours as needed for nausea or vomiting., Disp: 20 tablet, Rfl: 0   CALCIUM PO, Take by mouth., Disp: , Rfl:    Carbinoxamine Maleate (RYVENT) 6 MG TABS, Take 6 mg by mouth 2 (two) times daily as needed., Disp: 120 tablet, Rfl: 1   citalopram (CELEXA) 10 MG tablet, TAKE 1 TABLET(10 MG) BY MOUTH DAILY, Disp: 30 tablet, Rfl: 2   DERMA-SMOOTHE/FS BODY 0.01 % OIL, Apply 1 application topically daily. After shower (Patient not taking: Reported on 10/24/2020), Disp: , Rfl:    Fe Fum-Fe Poly-Vit C-Lactobac (FUSION) 65-65-25-30 MG CAPS, Take 1 capsule by mouth daily. (Patient taking differently: Take 1 capsule by mouth every other day.), Disp: 30 capsule, Rfl: 5   hydrOXYzine (ATARAX/VISTARIL) 50 MG tablet, TAKE 1 TO 2 TABLETS BY MOUTH TWICE DAILY FOR ITCHING (Patient not taking: Reported on 10/24/2020), Disp: 30 tablet, Rfl: 0   MAGNESIUM  PO, Take by mouth., Disp: , Rfl:    MELATONIN PO, Take 10 drops by mouth at bedtime as needed (sleep). (Patient not taking: No sig reported), Disp: , Rfl:    OVER THE COUNTER MEDICATION, Take 1 packet by mouth daily. Emergen-C  Vitamin C & zinc, Disp: , Rfl:    pimozide (ORAP) 2 MG tablet, Take 2 mg by mouth daily. (Patient not taking: No sig reported), Disp: , Rfl:    senna-docusate (SENOKOT-S) 8.6-50 MG tablet, Take 1 tablet by mouth at bedtime as needed for mild constipation or moderate constipation., Disp: 20 tablet, Rfl: 0   VENTOLIN HFA 108 (90 Base) MCG/ACT inhaler, Inhale 1 puff into the lungs every 4 (four) hours as needed for shortness of breath or wheezing., Disp: , Rfl:    VITAMIN D PO, Take by mouth., Disp: , Rfl:    No Known Allergies  Past Medical History:  Diagnosis Date   Anemia    Anemia    Anxiety    Asthma    GERD (gastroesophageal reflux disease) 01/01/2018   Near syncope 01/01/2018   Palpitations 01/01/2018   Sickle cell trait ([facility])    Vitamin D deficiency      Past Surgical History:  Procedure Laterality Date   CESAREAN SECTION     WISDOM TOOTH EXTRACTION      Family History  Problem Relation Age of Onset   Healthy Mother    Healthy Father    CAD Sister  Stroke Maternal Grandmother     Social History   Tobacco Use   Smoking status: Never   Smokeless tobacco: Never  Vaping Use   Vaping Use: Never used  Substance Use Topics   Alcohol use: No   Drug use: No    ROS   Objective:   Vitals: BP 117/81   Pulse 69   Temp 97.8 F (36.6 C)   Resp 19   LMP 02/17/2021 (Exact Date)   SpO2 97%   Physical Exam Constitutional:      General: She is not in acute distress.    Appearance: Normal appearance. She is well-developed and normal weight. She is not ill-appearing, toxic-appearing or diaphoretic.  HENT:     Head: Normocephalic and atraumatic.     Right Ear: External ear normal.     Left Ear: External ear normal.     Nose: Nose normal.      Mouth/Throat:     Mouth: Mucous membranes are moist.     Pharynx: Oropharynx is clear.  Eyes:     General: No scleral icterus.    Extraocular Movements: Extraocular movements intact.     Pupils: Pupils are equal, round, and reactive to light.  Cardiovascular:     Rate and Rhythm: Normal rate and regular rhythm.     Heart sounds: Normal heart sounds. No murmur heard.   No friction rub. No gallop.  Pulmonary:     Effort: Pulmonary effort is normal. No respiratory distress.     Breath sounds: Normal breath sounds. No stridor. No wheezing, rhonchi or rales.  Abdominal:     General: Bowel sounds are increased. There is no distension.     Palpations: Abdomen is soft. There is no mass.     Tenderness: There is generalized abdominal tenderness. There is no right CVA tenderness, left CVA tenderness, guarding or rebound.  Skin:    General: Skin is warm and dry.     Coloration: Skin is not pale.     Findings: No rash.  Neurological:     General: No focal deficit present.     Mental Status: She is alert and oriented to person, place, and time.  Psychiatric:        Mood and Affect: Mood normal.        Behavior: Behavior normal.        Thought Content: Thought content normal.        Judgment: Judgment normal.    Results for orders placed or performed during the hospital encounter of 03/20/21 (from the past 24 hour(s))  POC Urinalysis dipstick     Status: None   Collection Time: 03/20/21  1:18 PM  Result Value Ref Range   Glucose, UA NEGATIVE NEGATIVE mg/dL   Bilirubin Urine NEGATIVE NEGATIVE   Ketones, ur NEGATIVE NEGATIVE mg/dL   Specific Gravity, Urine 1.020 1.005 - 1.030   Hgb urine dipstick NEGATIVE NEGATIVE   pH 5.5 5.0 - 8.0   Protein, ur NEGATIVE NEGATIVE mg/dL   Urobilinogen, UA 0.2 0.0 - 1.0 mg/dL   Nitrite NEGATIVE NEGATIVE   Leukocytes,Ua NEGATIVE NEGATIVE    Assessment and Plan :   PDMP not reviewed this encounter.  1. Colitis   2. Diarrhea, unspecified type   3.  Urinary frequency   4. Generalized abdominal pain     Will manage for suspected viral colitis with supportive care.  Recommended patient hydrate well, eat light meals and maintain electrolytes.  Will use Zofran and Imodium for nausea, vomiting  and diarrhea. COVID 19, GI panel testing pending. Counseled patient on potential for adverse effects with medications prescribed/recommended today, ER and return-to-clinic precautions discussed, patient verbalized understanding.    Wallis Bamberg, [place] 03/20/21 1358

## 2021-03-22 ENCOUNTER — Telehealth (HOSPITAL_COMMUNITY): Payer: Self-pay | Admitting: Physician Assistant

## 2021-03-22 NOTE — Telephone Encounter (Signed)
Order not released, reordered per lab

## 2021-03-23 LAB — GASTROINTESTINAL PANEL BY PCR, STOOL (REPLACES STOOL CULTURE)

## 2021-04-11 ENCOUNTER — Ambulatory Visit: Payer: 59 | Admitting: Internal Medicine

## 2021-05-16 ENCOUNTER — Ambulatory Visit: Payer: 59 | Admitting: Internal Medicine

## 2021-05-25 ENCOUNTER — Ambulatory Visit (INDEPENDENT_AMBULATORY_CARE_PROVIDER_SITE_OTHER): Payer: 59 | Admitting: Nurse Practitioner

## 2021-05-25 ENCOUNTER — Encounter: Payer: Self-pay | Admitting: Nurse Practitioner

## 2021-05-25 ENCOUNTER — Other Ambulatory Visit: Payer: Self-pay

## 2021-05-25 VITALS — BP 124/80 | HR 75 | Temp 97.8°F | Ht 64.0 in | Wt 220.6 lb

## 2021-05-25 DIAGNOSIS — E669 Obesity, unspecified: Secondary | ICD-10-CM

## 2021-05-25 DIAGNOSIS — F411 Generalized anxiety disorder: Secondary | ICD-10-CM

## 2021-05-25 DIAGNOSIS — K5900 Constipation, unspecified: Secondary | ICD-10-CM

## 2021-05-25 DIAGNOSIS — E782 Mixed hyperlipidemia: Secondary | ICD-10-CM

## 2021-05-25 DIAGNOSIS — K429 Umbilical hernia without obstruction or gangrene: Secondary | ICD-10-CM | POA: Diagnosis not present

## 2021-05-25 DIAGNOSIS — Z6837 Body mass index (BMI) 37.0-37.9, adult: Secondary | ICD-10-CM

## 2021-05-25 NOTE — Progress Notes (Signed)
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  This visit occurred during the SARS-CoV-2 public health emergency.  Safety protocols were in place, including screening questions prior to the visit, additional usage of staff PPE, and extensive cleaning of exam room while observing appropriate contact time as indicated for disinfecting solutions.  Subjective:     Patient ID: Tammie Williams , female    DOB: October 07, 1970 , 50 y.o.   MRN: [id number]   Chief Complaint  Patient presents with   hernia    She stated she is starting to have pain where her hernia is from straining to use the bathroom.    Anxiety    She would like her FMLA forms to be renewed.     HPI  Patient presents today for a f/u on her anxiety. She stated she would like her FMLA forms to be renewed. She also complains of having pain her stomach area. She stated she has a hernia there and it has flared because she has been straining to use the bathroom.  FMLA: for anxiety and depression.  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 She would like to take 1-2 day a month off from work to get a mental break. 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   Diet: she is not eating properly. She tries to eat fruits and vegetables.     Anxiety Presents for follow-up visit. Patient reports no chest pain, dizziness or palpitations.      Past Medical History:  Diagnosis Date   Anemia    Anemia    Anxiety    Asthma    GERD (gastroesophageal reflux disease) 01/01/2018   Near syncope 01/01/2018   Palpitations 01/01/2018   Sickle cell trait (HCC)    Vitamin D deficiency      Family History  Problem Relation Age of Onset   Healthy Mother    Healthy Father    CAD Sister    Stroke Maternal Grandmother      Current Outpatient  Medications:    CALCIUM PO, Take by mouth., Disp: , Rfl:    citalopram (CELEXA) 10 MG tablet, TAKE 1 TABLET(10 MG) BY MOUTH DAILY, Disp: 30 tablet, Rfl: 2   MAGNESIUM PO, Take by mouth., Disp: , Rfl:    ondansetron (ZOFRAN-ODT) 8 MG disintegrating tablet, Take 1 tablet (8 mg total) by mouth every 8 (eight) hours as needed for nausea or vomiting., Disp: 20 tablet, Rfl: 0   OVER THE COUNTER MEDICATION, Take 1 packet by mouth daily. Emergen-C  Vitamin C & zinc, Disp: , Rfl:    VENTOLIN HFA 108 (90 Base) MCG/ACT inhaler, Inhale 1 puff into the lungs every 4 (four) hours as needed for shortness of breath or wheezing., Disp: , Rfl:    VITAMIN D PO, Take by mouth., Disp: , Rfl:    Carbinoxamine Maleate (RYVENT) 6 MG TABS, Take 6 mg by mouth 2 (two) times daily as needed. (Patient not taking: Reported on 05/25/2021), Disp: 120 tablet, Rfl: 1   Fe Fum-Fe Poly-Vit C-Lactobac (FUSION) 65-65-25-30 MG CAPS, Take 1 capsule by mouth daily. (Patient not taking: Reported on 05/25/2021), Disp: 30 capsule, Rfl: 5   loperamide (IMODIUM) 2 MG capsule, Take 1 capsule (2 mg total) by mouth 2 (two) times daily as needed for diarrhea or loose stools. (Patient  not taking: Reported on 05/25/2021), Disp: 14 capsule, Rfl: 0   senna-docusate (SENOKOT-S) 8.6-50 MG tablet, Take 1 tablet by mouth at bedtime as needed for mild constipation or moderate constipation. (Patient not taking: Reported on 05/25/2021), Disp: 20 tablet, Rfl: 0   No Known Allergies   Review of Systems  Constitutional: Negative.  Negative for chills and fever.  HENT:  Negative for congestion, sinus pressure and sinus pain.   Eyes: Negative.   Respiratory:  Negative for cough and wheezing.   Cardiovascular:  Negative for chest pain and palpitations.  Gastrointestinal:  Positive for abdominal pain and constipation.  Musculoskeletal: Negative.   Neurological:  Negative for dizziness, weakness and headaches.  Psychiatric/Behavioral: Negative.      Today's  Vitals   05/25/21 1629  BP: 124/80  Pulse: 75  Temp: 97.8 F (36.6 C)  Weight: 220 lb 9.6 oz (100.1 kg)  Height: 5\' 4"  (1.626 m)   Body mass index is 37.87 kg/m.  Wt Readings from Last 3 Encounters:  05/25/21 220 lb 9.6 oz (100.1 kg)  10/24/20 216 lb 9.6 oz (98.2 kg)  03/31/20 194 lb 12.8 oz (88.4 kg)     Objective:  Physical Exam Constitutional:      Appearance: Normal appearance.  HENT:     Head: Atraumatic.  Cardiovascular:     Rate and Rhythm: Normal rate and regular rhythm.     Pulses: Normal pulses.     Heart sounds: Normal heart sounds. No murmur heard. Pulmonary:     Effort: Pulmonary effort is normal. No respiratory distress.     Breath sounds: Normal breath sounds.  Abdominal:     Palpations: There is no mass.     Hernia: A hernia is present.     Comments: Umblical hernia   Skin:    General: Skin is warm and dry.     Capillary Refill: Capillary refill takes less than 2 seconds.  Neurological:     Mental Status: She is alert and oriented to person, place, and time.        Assessment And Plan:     1. GAD (generalized anxiety disorder) -Pt currently taking celexa. Referral for psy has already been placed from her last visit. Advised patient to use relaxation methods and techniques to relieve her anxiety and stress.   2. Constipation, unspecified constipation type -Increase fiber intake. Went over diet  -Increase physical activity -OTC miralex and stool softer.   3. Umbilical hernia without obstruction and without gangrene - Ambulatory referral to General Surgery  4. Mixed hyperlipidemia - Lipid panel; Future  5. Class 2 obesity without serious comorbidity with body mass index (BMI) of 37.0 to 37.9 in adult, unspecified obesity type  -Advised patient on a healthy diet including avoiding fast food and red meats. Increase the intake of lean meats including grilled chicken and 06/01/20.  Drink a lot of water. Decrease intake of fatty foods. Exercise for  30-45 min. 4-5 a week to decrease the risk of cardiac event.   Patient requesting FMLA to take 1-2 days off from work each month for a mental break for the next 6 months. Pt. Will get work to send FMLA papers to [place] to fill out.   The patient was encouraged to call or send a message through MyChart for any questions or concerns.   Follow up: if symptoms persist or do not get better.   Side effects and appropriate use of all the medication(s) were discussed with the patient today. Patient  advised to use the medication(s) as directed by their healthcare provider. The patient was encouraged to read, review, and understand all associated package inserts and contact our office with any questions or concerns. The patient accepts the risks of the treatment plan and had an opportunity to ask questions.   Staying healthy and adopting a healthy lifestyle for your overall health is important. You should eat 7 or more servings of fruits and vegetables per day. You should drink plenty of water to keep yourself hydrated and your kidneys healthy. This includes about 65-80+ fluid ounces of water. Limit your intake of animal fats especially for elevated cholesterol. Avoid highly processed food and limit your salt intake if you have hypertension. Avoid foods high in saturated/Trans fats. Along with a healthy diet it is also very important to maintain time for yourself to maintain a healthy mental health with low stress levels. You should get atleast 150 min of moderate intensity exercise weekly for a healthy heart. Along with eating right and exercising, aim for at least 7-9 hours of sleep daily.  Eat more whole grains which includes barley, wheat berries, oats, brown rice and whole wheat pasta. Use healthy plant oils which include olive, soy, corn, sunflower and peanut. Limit your caffeine and sugary drinks. Limit your intake of fast foods. Limit milk and dairy products to one or two daily servings.   Patient was given  opportunity to ask questions. Patient verbalized understanding of the plan and was able to repeat key elements of the plan. All questions were answered to their satisfaction.  Raman Alverda Nazzaro, DNP   I, Raman Oleva Koo have reviewed all documentation for this visit. The documentation on 05/25/21 for the exam, diagnosis, procedures, and orders are all accurate and complete.    IF YOU HAVE BEEN REFERRED TO A SPECIALIST, IT MAY TAKE 1-2 WEEKS TO SCHEDULE/PROCESS THE REFERRAL. IF YOU HAVE NOT HEARD FROM US/SPECIALIST IN TWO WEEKS, PLEASE GIVE [place] A CALL AT [phone].   THE PATIENT IS ENCOURAGED TO PRACTICE SOCIAL DISTANCING DUE TO THE COVID-19 PANDEMIC.

## 2021-05-26 ENCOUNTER — Encounter: Payer: Self-pay | Admitting: Internal Medicine

## 2021-06-06 ENCOUNTER — Ambulatory Visit: Payer: 59 | Admitting: Surgery

## 2021-06-22 ENCOUNTER — Encounter: Payer: Self-pay | Admitting: Surgery

## 2021-06-22 ENCOUNTER — Other Ambulatory Visit: Payer: Self-pay

## 2021-06-22 ENCOUNTER — Ambulatory Visit (INDEPENDENT_AMBULATORY_CARE_PROVIDER_SITE_OTHER): Payer: 59 | Admitting: Surgery

## 2021-06-22 VITALS — BP 142/86 | HR 91 | Temp 98.0°F | Ht 64.0 in | Wt 218.4 lb

## 2021-06-22 DIAGNOSIS — K429 Umbilical hernia without obstruction or gangrene: Secondary | ICD-10-CM

## 2021-06-22 DIAGNOSIS — M6208 Separation of muscle (nontraumatic), other site: Secondary | ICD-10-CM | POA: Diagnosis not present

## 2021-06-22 NOTE — Progress Notes (Signed)
Patient ID: Tammie Williams, female   DOB: [birth year], 50 y.o.   MRN: [id number]  Chief Complaint: Umbilical hernia  History of Present Illness Tammie Williams is a 50 y.o. female with pain in the umbilical area.  Most recently exacerbated by her sinus issues with drainage and associated cough.  She is also reports it bothers her during times when she is straining.  She admits to constipation often skipping a day without a bowel movement.  She reports she has had conversations regarding fiber supplementation, but never initiated it.  The only other time she has some discomfort is when she is lifting groceries.  She denies any bowel habit changes, pain at rest, or need to reduce the bulge.  Past Medical History Past Medical History:  Diagnosis Date   Anemia    Anemia    Anxiety    Asthma    GERD (gastroesophageal reflux disease) 01/01/2018   Near syncope 01/01/2018   Palpitations 01/01/2018   Sickle cell trait (HCC)    Vitamin D deficiency       Past Surgical History:  Procedure Laterality Date   CESAREAN SECTION     WISDOM TOOTH EXTRACTION      No Known Allergies  Current Outpatient Medications  Medication Sig Dispense Refill   amoxicillin-clavulanate (AUGMENTIN) 875-125 MG tablet SMARTSIG:1 Tablet(s) By Mouth Morning-Night     CALCIUM PO Take by mouth.     citalopram (CELEXA) 10 MG tablet TAKE 1 TABLET(10 MG) BY MOUTH DAILY 30 tablet 2   MAGNESIUM PO Take by mouth.     ondansetron (ZOFRAN-ODT) 8 MG disintegrating tablet Take 1 tablet (8 mg total) by mouth every 8 (eight) hours as needed for nausea or vomiting. 20 tablet 0   OVER THE COUNTER MEDICATION Take 1 packet by mouth daily. Emergen-C  Vitamin C & zinc     predniSONE (DELTASONE) 20 MG tablet Take 20 mg by mouth 2 (two) times daily.     VENTOLIN HFA 108 (90 Base) MCG/ACT inhaler Inhale 1 puff into the lungs every 4 (four) hours as needed for shortness of breath or wheezing.     VITAMIN D PO Take by mouth.     No current  facility-administered medications for this visit.    Family History Family History  Problem Relation Age of Onset   Healthy Mother    Healthy Father    CAD Sister    Stroke Maternal Grandmother    Bone cancer Maternal Grandmother    Cancer Maternal Uncle       Social History Social History   Tobacco Use   Smoking status: Never    Passive exposure: Never   Smokeless tobacco: Never  Vaping Use   Vaping Use: Never used  Substance Use Topics   Alcohol use: No   Drug use: No        Review of Systems  Constitutional:  Positive for diaphoresis.  Eyes:  Positive for blurred vision.  Respiratory:  Positive for cough, wheezing and stridor.   Cardiovascular:  Positive for palpitations.  Gastrointestinal:  Positive for abdominal pain, constipation, diarrhea, nausea and vomiting.  Genitourinary:  Positive for dysuria, frequency and urgency.  Skin:  Positive for itching.  Neurological: Negative.   Psychiatric/Behavioral:  Positive for depression.      Physical Exam Blood pressure (!) 142/86, pulse 91, temperature 98 F (36.7 C), height 5\' 4"  (1.626 m), weight 218 lb 6.4 oz (99.1 kg), SpO2 97 %. Last Weight  Most recent update: 06/22/2021  9:23 AM    Weight  99.1 kg (218 lb 6.4 oz)             CONSTITUTIONAL: Well developed, and nourished, appropriately responsive and aware without distress.   EYES: Sclera non-icteric.   EARS, NOSE, MOUTH AND THROAT: Mask worn.   The oropharynx is clear. Oral mucosa is pink and moist.    Hearing is intact to voice.  NECK: Trachea is midline, and there is no jugular venous distension.  LYMPH NODES:  Lymph nodes in the neck are not enlarged. RESPIRATORY:  Lungs are clear, and breath sounds are equal bilaterally. Normal respiratory effort without pathologic use of accessory muscles. CARDIOVASCULAR: Heart is regular in rate and rhythm. GI: The abdomen is soft, nontender, and nondistended.  There is no evidence of a hernia sac, there is a  transition at the fascial dermal union.  There is slight tenderness around the prominent fascial ring with slight laxity in the dermis, but it is intact and adherent to the fascia circumferentially, there were no palpable masses.  No hernia sac.  I did not appreciate hepatosplenomegaly. There were normal bowel sounds. Mild degree of diastases recti cephalad to this point. MUSCULOSKELETAL:  Symmetrical muscle tone appreciated in all four extremities.    SKIN: Skin turgor is normal. No pathologic skin lesions appreciated.  NEUROLOGIC:  Motor and sensation appear grossly normal.  Cranial nerves are grossly without defect. PSYCH:  Alert and oriented to person, place and time. Affect is appropriate for situation.  Data Reviewed I have personally reviewed what is currently available of the patient's imaging, recent labs and medical records.   Labs:  CBC Latest Ref Rng & Units 10/24/2020 11/30/2019 10/22/2019  WBC 3.4 - 10.8 x10E3/uL 8.9 15.3(H) 8.4  Hemoglobin 11.1 - 15.9 g/dL 27.7 11.3(L) 11.1  Hematocrit 34.0 - 46.6 % 38.1 35.9(L) 35.4  Platelets 150 - 450 x10E3/uL 310 399 413   CMP Latest Ref Rng & Units 10/24/2020 11/30/2019 10/22/2019  Glucose 65 - 99 mg/dL 85 88 89  BUN 6 - 24 mg/dL 7 16 11   Creatinine 0.57 - 1.00 mg/dL 4.12 8.78  Sodium 134 - 144 mmol/L 143 141 141  Potassium 3.5 - 5.2 mmol/L 4.3 3.7 4.4  Chloride 96 - 106 mmol/L 110(H) 106 105  CO2 20 - 29 mmol/L 23 26 24   Calcium 8.7 - 10.2 mg/dL 9.6 9.2 9.9  Total Protein 6.0 - 8.5 g/dL 6.8 7.2 6.7  Total Bilirubin 0.0 - 1.2 mg/dL 0.3 0.3 0.2  Alkaline Phos 44 - 121 IU/L 122(H) 91 129(H)  AST 0 - 40 IU/L 15 20 20   ALT 0 - 32 IU/L 10 14 13       Imaging:  Within last 24 hrs: No results found.  Assessment    Umbilical pain, intact fascial dermal union in the periumbilical circle, without significant hernia sac or bulge. Mild degree of diastases recti cephalad to this point. Patient Active Problem List   Diagnosis Date Noted    Current moderate episode of major depressive disorder, unspecified whether recurrent (HCC) 10/24/2020   Iron deficiency anemia due to chronic blood loss 06/07/2020   GERD (gastroesophageal reflux disease) 01/01/2018   Palpitations 01/01/2018   Near syncope 01/01/2018   GAD (generalized anxiety disorder) 04/24/2011    Plan    We discussed options of open primary repair, discouraging consideration of a mesh repair at this point in time.  We discussed alleviating the symptoms that seem to be provoking her presentation which include  her sinus infection and associated cough, along with her constipation.  Advised to pursue a goal of 25 to 30 g of fiber daily.  Made aware that the majority of this may be through natural sources, but advised to be aware of actual consumption and to ensure minimal consumption by daily supplementation.  Various forms of supplements discussed.  Strongly advised to consume more fluids to ensure adequate hydration, instructed to watch color of urine to determine adequacy of hydration.  Clarity is pursued in urine output, and bowel activity that correlates to significant meal intake.  Patient is to avoid deferring having bowel movements, advised to take the time at the first sign of sensation, typically following meals and in the morning.  Subsequent utilization of MiraLAX to ensure at least daily movement, ideally twice daily bowel movements.  If multiple doses of MiraLAX are necessary utilize them.   She elected to try the above options, deferring consideration of repair, will reevaluate in 3 months to see how she is doing.  Face-to-face time spent with the patient and accompanying care providers(if present) was 30 minutes, with more than 50% of the time spent counseling, educating, and coordinating care of the patient.    These notes generated with voice recognition software. I apologize for typographical errors.  Campbell Lerner M.D., FACS 06/22/2021, 9:39 AM

## 2021-06-22 NOTE — Patient Instructions (Addendum)
Advised to pursue a goal of 25 to 30 g of fiber daily.  Made aware that the majority of this may be through natural sources, but advised to be aware of actual consumption and to ensure minimal consumption by daily supplementation.  Various forms of supplements discussed(gummies,fiber power,wafers).  Strongly advised to consume more fluids to ensure adequate hydration, instructed to watch color of urine to determine adequacy of hydration. Clarity is pursued in urine output, and bowel activity that correlates to significant meal intake.  Patient is to avoid deferring having bowel movements, advised to take the time at the first sign of sensation, typically following meals and in the morning. May use Miralax if you are not having bowel movements daily with this regimen.    Follow up here in 2 month to reassess your hernia.    Umbilical Hernia, Adult A hernia is a bulge of tissue that pushes through an opening between muscles. An umbilical hernia happens in the abdomen, near the belly button (umbilicus). The hernia may contain tissues from the small intestine, large intestine, or fatty tissue covering the intestines (omentum). Umbilical hernias in adults tend to get worse over time, and they require surgical treatment. There are several types of umbilical hernias. You may have: A hernia located just above or below the umbilicus (indirect hernia). This is the most common type of umbilical hernia in adults. A hernia that forms through an opening formed by the umbilicus (direct hernia). A hernia that comes and goes (reducible hernia). A reducible hernia may be visible only when you strain, lift something heavy, or cough. This type of hernia can be pushed back into the abdomen (reduced). A hernia that traps abdominal tissue inside the hernia (incarcerated hernia). This type of hernia cannot be reduced. A hernia that cuts off blood flow to the tissues inside the hernia (strangulated hernia). The tissues can  start to die if this happens. This type of hernia requires emergency treatment.  What are the causes? An umbilical hernia happens when tissue inside the abdomen presses on a weak area of the abdominal muscles. What increases the risk? You may have a greater risk of this condition if you: Are obese. Have had several pregnancies. Have a buildup of fluid inside your abdomen (ascites). Have had surgery that weakens the abdominal muscles.  What are the signs or symptoms? The main symptom of this condition is a painless bulge at or near the belly button. A reducible hernia may be visible only when you strain, lift something heavy, or cough. Other symptoms may include: Dull pain. A feeling of pressure.  Symptoms of a strangulated hernia may include: Pain that gets increasingly worse. Nausea and vomiting. Pain when pressing on the hernia. Skin over the hernia becoming red or purple. Constipation. Blood in the stool.  How is this diagnosed? This condition may be diagnosed based on: A physical exam. You may be asked to cough or strain while standing. These actions increase the pressure inside your abdomen and force the hernia through the opening in your muscles. Your health care provider may try to reduce the hernia by pressing on it. Your symptoms and medical history.  How is this treated? Surgery is the only treatment for an umbilical hernia. Surgery for a strangulated hernia is done as soon as possible. If you have a small hernia that is not incarcerated, you may need to lose weight before having surgery. Follow these instructions at home: Lose weight, if told by your health care provider. Do  not try to push the hernia back in. Watch your hernia for any changes in color or size. Tell your health care provider if any changes occur. You may need to avoid activities that increase pressure on your hernia. Do not lift anything that is heavier than 10 lb (4.5 kg) until your health care  provider says that this is safe. Take over-the-counter and prescription medicines only as told by your health care provider. Keep all follow-up visits as told by your health care provider. This is important. Contact a health care provider if: Your hernia gets larger. Your hernia becomes painful. Get help right away if: You develop sudden, severe pain near the area of your hernia. You have pain as well as nausea or vomiting. You have pain and the skin over your hernia changes color. You develop a fever. This information is not intended to replace advice given to you by your health care provider. Make sure you discuss any questions you have with your health care provider. Document Released: 02/17/2016 Document Revised: 05/20/2016 Document Reviewed: 02/17/2016 Elsevier Interactive Patient Education  [employer].

## 2021-06-26 ENCOUNTER — Other Ambulatory Visit: Payer: Self-pay | Admitting: Internal Medicine

## 2021-06-29 ENCOUNTER — Ambulatory Visit: Payer: 59 | Admitting: Internal Medicine

## 2021-08-14 ENCOUNTER — Encounter (HOSPITAL_COMMUNITY): Payer: Self-pay | Admitting: Emergency Medicine

## 2021-08-14 ENCOUNTER — Other Ambulatory Visit: Payer: Self-pay

## 2021-08-14 ENCOUNTER — Ambulatory Visit (HOSPITAL_COMMUNITY)
Admission: EM | Admit: 2021-08-14 | Discharge: 2021-08-14 | Disposition: A | Discharge status: Home / Self Care | Payer: 59 | Attending: Sports Medicine | Admitting: Sports Medicine

## 2021-08-14 DIAGNOSIS — H833X1 Noise effects on right inner ear: Secondary | ICD-10-CM

## 2021-08-14 DIAGNOSIS — H9201 Otalgia, right ear: Secondary | ICD-10-CM | POA: Diagnosis not present

## 2021-08-14 DIAGNOSIS — M26601 Right temporomandibular joint disorder, unspecified: Secondary | ICD-10-CM | POA: Diagnosis not present

## 2021-08-14 DIAGNOSIS — M26609 Unspecified temporomandibular joint disorder, unspecified side: Secondary | ICD-10-CM

## 2021-08-14 NOTE — ED Provider Notes (Signed)
[facility]    CSN: [id number] Arrival date & time: 08/14/21  1734      History   Chief Complaint Chief Complaint  Patient presents with   Otalgia    HPI Tammie Williams is a 50 y.o. female here for right otalgia.  Patient presents for right otalgia x2-3 weeks.  Works on [employer] for her job and uses a Multimedia programmer for hearing.  She states about 2 weeks ago she was on a call and trying to turn the volume up because she cannot hear very well where she had a high pitch sound that caused pain in the right ear.  Since then she has felt pain deep within the right ear.  She initially reported some ringing/tinnitus in the ear, but states this is improved.  She also has pain near the right jawline and just superior to the ear.  She states she does have a history of bruxism and does wear a mouthguard at nighttime for this.  The pain is an aching pain throughout the day, but will be sharp at times.  It does keep her awake at night at times.  She denies any discharge from the ear, no redness or swelling around the ear.  She denies any other symptoms or URI including no nasal congestion/rhinorrhea, sore throat.  She denies any fever or chills, otherwise feels well.   Past Medical History:  Diagnosis Date   Anemia    Anemia    Anxiety    Asthma    GERD (gastroesophageal reflux disease) 01/01/2018   Near syncope 01/01/2018   Palpitations 01/01/2018   Sickle cell trait ([place])    Vitamin D deficiency     Patient Active Problem List   Diagnosis Date Noted   Current moderate episode of major depressive disorder, unspecified whether recurrent ([place]) 10/24/2020   Iron deficiency anemia due to chronic blood loss 06/07/2020   GERD (gastroesophageal reflux disease) 01/01/2018   Palpitations 01/01/2018   Near syncope 01/01/2018   GAD (generalized anxiety disorder) 04/24/2011    Past Surgical History:  Procedure Laterality Date   CESAREAN SECTION     WISDOM TOOTH EXTRACTION      OB History      Gravida  4   Para  1   Term  1   Preterm      AB  3   Living  1      SAB  1   IAB  1   Ectopic  1   Multiple      Live Births               Home Medications    Prior to Admission medications   Medication Sig Start Date End Date Taking? Authorizing Provider  amoxicillin-clavulanate (AUGMENTIN) 875-125 MG tablet SMARTSIG:1 Tablet(s) By Mouth Morning-Night 06/15/21   [provider]  CALCIUM PO Take by mouth.    [provider]  citalopram (CELEXA) 10 MG tablet TAKE 1 TABLET(10 MG) BY MOUTH DAILY 06/26/21   Glendale Chard, MD  MAGNESIUM PO Take by mouth.    [provider]  ondansetron (ZOFRAN-ODT) 8 MG disintegrating tablet Take 1 tablet (8 mg total) by mouth every 8 (eight) hours as needed for nausea or vomiting. 03/20/21   Jaynee Eagles, PA-C  OVER THE COUNTER MEDICATION Take 1 packet by mouth daily. Emergen-C  Vitamin C & zinc    [provider]  predniSONE (DELTASONE) 20 MG tablet Take 20 mg by mouth 2 (two)  times daily. 06/15/21   [provider]  VENTOLIN HFA 108 (90 Base) MCG/ACT inhaler Inhale 1 puff into the lungs every 4 (four) hours as needed for shortness of breath or wheezing. 11/26/19   [provider]  VITAMIN D PO Take by mouth.    [provider]    Family History Family History  Problem Relation Age of Onset   Healthy Mother    Healthy Father    CAD Sister    Stroke Maternal Grandmother    Bone cancer Maternal Grandmother    Cancer Maternal Uncle     Social History Social History   Tobacco Use   Smoking status: Never    Passive exposure: Never   Smokeless tobacco: Never  Vaping Use   Vaping Use: Never used  Substance Use Topics   Alcohol use: No   Drug use: No     Allergies   Patient has no known allergies.   Review of Systems Review of Systems  Constitutional:  Negative for chills, fatigue and fever.  HENT:  Positive for ear pain and tinnitus (initially, but now  resolved). Negative for congestion, ear discharge, rhinorrhea, sore throat and trouble swallowing.   Eyes:  Negative for pain.  Neurological:  Positive for headaches. Negative for dizziness and weakness.    Physical Exam Triage Vital Signs ED Triage Vitals  Enc Vitals Group     BP 08/14/21 1844 (!) 151/91     Pulse Rate 08/14/21 1844 85     Resp 08/14/21 1844 18     Temp 08/14/21 1844 97.9 F (36.6 C)     Temp Source 08/14/21 1844 Oral     SpO2 08/14/21 1844 99 %     Weight --      Height --      Head Circumference --      Peak Flow --      Pain Score 08/14/21 1842 9     Pain Loc --      Pain Edu? --      Excl. in [place]? --    No data found.  Updated Vital Signs BP (!) 151/91 (BP Location: Left Arm)   Pulse 85   Temp 97.9 F (36.6 C) (Oral)   Resp 18   LMP 08/04/2021   SpO2 99%    Physical Exam Constitutional:      General: She is not in acute distress.    Appearance: Normal appearance. She is not toxic-appearing.  HENT:     Head: Normocephalic and atraumatic.     Comments: + TTP over TMJ region R > L No erythema, swelling, or ecchymosis of posterior auricular area    Right Ear: Tympanic membrane and ear canal normal.     Left Ear: Tympanic membrane and ear canal normal.     Nose: Nose normal. No congestion or rhinorrhea.     Mouth/Throat:     Mouth: Mucous membranes are moist.  Eyes:     Extraocular Movements: Extraocular movements intact.     Pupils: Pupils are equal, round, and reactive to light.  Cardiovascular:     Rate and Rhythm: Normal rate.  Pulmonary:     Effort: Pulmonary effort is normal. No respiratory distress.  Musculoskeletal:     Cervical back: Neck supple.  Neurological:     Mental Status: She is alert.  Psychiatric:        Mood and Affect: Mood normal.     UC Treatments / Results  Labs (all labs  ordered are listed, but only abnormal results are displayed) Labs Reviewed - No data to display  EKG   Radiology No results  found.  Procedures Procedures (including critical care time)  Medications Ordered in UC Medications - No data to display  Initial Impression / Assessment and Plan / UC Course  I have reviewed the triage vital signs and the nursing notes.  Pertinent labs & imaging results that were available during my care of the patient were reviewed by me and considered in my medical decision making (see chart for details).     Acoustic trauma right ear with otalgia TMJ syndrome - with bruxism  Patient has had right ear pain for the last 2-3 weeks after high-frequency volume injury involving her headset at work.  Initially had tinnitus, but this has improved.  Still has pain deep within the ear.  There is no evidence of perforation or tympanic membrane irregularity on expection today.  She is tender over the TMJ region bilaterally, although right greater than left.  Discussed treating conservatively, as well as taking analgesics for the pain, supportive treatments discussed and written out for patient for her TMJ syndrome.  Did provide information for ENT, the patient may follow-up with them so that they can consider audiometry or further evaluation of the ear if not improved.  She is agreeable to this and understanding.  She is safe for discharge home. Final Clinical Impressions(s) / UC Diagnoses   Final diagnoses:  Acoustic trauma of right ear  TMJ (temporomandibular joint syndrome)  Right ear pain     Discharge Instructions      For ear pain and TMJ syndrome:  No chewing gum or ice, pencils Avoid tooth grinding and tooth clenching Avoid excessive jaw opening (e.g. Yawning or on tooth hygiene such as Tooth Brushing) Very soft diet Analgesics NSAIDs (Ibuprofen, Tylenol, Aleve) Heating pad or local moist heat as needed Elevate head of bed to 30 degrees or more  If this pain does not improve over the next week or so, would recommend evaluation by ENT     ED Prescriptions   None     PDMP not reviewed this encounter.   Madelyn Brunner, DO 08/14/21 1928

## 2021-08-14 NOTE — Discharge Instructions (Signed)
For ear pain and TMJ syndrome:  No chewing gum or ice, pencils Avoid tooth grinding and tooth clenching Avoid excessive jaw opening (e.g. Yawning or on tooth hygiene such as Tooth Brushing) Very soft diet Analgesics NSAIDs (Ibuprofen, Tylenol, Aleve) Heating pad or local moist heat as needed Elevate head of bed to 30 degrees or more  If this pain does not improve over the next week or so, would recommend evaluation by ENT

## 2021-08-14 NOTE — ED Triage Notes (Signed)
Pt reports having ear pains, esp on right that gotten worse over the past weekend. Reports wears a headset for 8 hours a day.  Pt reports chest pains over the weekend, so rested. Felt was related to anxiety.

## 2021-08-31 ENCOUNTER — Other Ambulatory Visit: Payer: Self-pay

## 2021-08-31 ENCOUNTER — Encounter: Payer: Self-pay | Admitting: Surgery

## 2021-08-31 ENCOUNTER — Ambulatory Visit (INDEPENDENT_AMBULATORY_CARE_PROVIDER_SITE_OTHER): Payer: 59 | Admitting: Surgery

## 2021-08-31 VITALS — BP 125/76 | HR 74 | Temp 98.3°F | Ht 65.0 in | Wt 223.0 lb

## 2021-08-31 DIAGNOSIS — Z6837 Body mass index (BMI) 37.0-37.9, adult: Secondary | ICD-10-CM | POA: Diagnosis not present

## 2021-08-31 DIAGNOSIS — R350 Frequency of micturition: Secondary | ICD-10-CM | POA: Insufficient documentation

## 2021-08-31 DIAGNOSIS — K5900 Constipation, unspecified: Secondary | ICD-10-CM

## 2021-08-31 DIAGNOSIS — N760 Acute vaginitis: Secondary | ICD-10-CM | POA: Insufficient documentation

## 2021-08-31 DIAGNOSIS — N920 Excessive and frequent menstruation with regular cycle: Secondary | ICD-10-CM | POA: Insufficient documentation

## 2021-08-31 DIAGNOSIS — M6208 Separation of muscle (nontraumatic), other site: Secondary | ICD-10-CM

## 2021-08-31 DIAGNOSIS — K429 Umbilical hernia without obstruction or gangrene: Secondary | ICD-10-CM

## 2021-08-31 NOTE — Patient Instructions (Addendum)
Please call our office if you have questions or concerns.   Advised to pursue a goal of 25 to 30 g of fiber daily.  Made aware that the majority of this may be through natural sources, but advised to be aware of actual consumption and to ensure minimal consumption by daily supplementation.  Various forms of supplements discussed.  Strongly advised to consume more fluids to ensure adequate hydration, instructed to watch color of urine to determine adequacy of hydration.  Clarity is pursued in urine output, and bowel activity that correlates to significant meal intake.  Patient is to avoid deferring having bowel movements, advised to take the time at the first sign of sensation, typically following meals and in the morning.  Subsequent utilization of MiraLAX to ensure at least daily movement, ideally twice daily bowel movements.  If multiple doses of MiraLAX are necessary utilize them.   Umbilical Hernia, Adult A hernia is a bulge of tissue that pushes through an opening between muscles. An umbilical hernia happens in the abdomen, near the belly button (umbilicus). The hernia may contain tissues from the small intestine, large intestine, or fatty tissue covering the intestines. Umbilical hernias in adults tend to get worse over time, and they require surgical treatment. There are different types of umbilical hernias, including: Indirect hernia. This type is located just above or below the umbilicus. It is the most common type of umbilical hernia in adults. Direct hernia. This type forms through an opening formed by the umbilicus. Reducible hernia. This type of hernia comes and goes. It may be visible only when you strain, lift something heavy, or cough. This type of hernia can be pushed back into the abdomen (reduced). Incarcerated hernia. This type traps abdominal tissue inside the hernia. This type of hernia cannot be reduced. Strangulated hernia. This type of hernia cuts off blood flow to the tissues  inside the hernia. The tissues can start to die if this happens. This type of hernia requires emergency treatment. What are the causes? An umbilical hernia happens when tissue inside the abdomen presses on a weak area of the abdominal muscles. What increases the risk? You may have a greater risk of this condition if you: Are obese. Have had several pregnancies. Have a buildup of fluid inside your abdomen. Have had surgery that weakens the abdominal muscles. What are the signs or symptoms? The main symptom of this condition is a painless bulge at or near the belly button. A reducible hernia may be visible only when you strain, lift something heavy, or cough. Other symptoms may include: Dull pain. A feeling of pressure. Symptoms of a strangulated hernia may include: Pain that gets increasingly worse. Nausea and vomiting. Pain when pressing on the hernia. Skin over the hernia becoming red or purple. Constipation. Blood in the stool. How is this diagnosed? This condition may be diagnosed based on: A physical exam. You may be asked to cough or strain while standing. These actions increase the pressure inside your abdomen and can force the hernia through the opening in your muscles. Your health care provider may try to reduce the hernia by pressing on it. Your symptoms and medical history. How is this treated? Surgery is the only treatment for an umbilical hernia. Surgery for a strangulated hernia is done as soon as possible. If you have a small hernia that is not incarcerated, you may need to lose weight before having surgery. Follow these instructions at home: Lose weight, if told by your health care provider.  Do not try to push the hernia back in. Watch your hernia for any changes in color or size. Tell your health care provider if any changes occur. You may need to avoid activities that increase pressure on your hernia. Do not lift anything that is heavier than 10 lb (4.5 kg), or the  limit that you are told, until your health care provider says that it is safe. Take over-the-counter and prescription medicines only as told by your health care provider. Keep all follow-up visits. This is important. Contact a health care provider if: Your hernia gets larger. Your hernia becomes painful. Get help right away if: You develop sudden, severe pain near the area of your hernia. You have pain as well as nausea or vomiting. You have pain and the skin over your hernia changes color. You develop a fever or chills. Summary A hernia is a bulge of tissue that pushes through an opening between muscles. An umbilical hernia happens near the belly button. Surgery is the only treatment for an umbilical hernia. Do not try to push your hernia back in. Keep all follow-up visits. This is important. This information is not intended to replace advice given to you by your health care provider. Make sure you discuss any questions you have with your health care provider. Document Revised: 04/25/2020 Document Reviewed: 04/25/2020 Elsevier Patient Education  2022 [employer].

## 2021-08-31 NOTE — Progress Notes (Signed)
Patient ID: Tammie Williams, female   DOB: [birth year], 50 y.o.   MRN: [id number]  Chief Complaint: Umbilical hernia  History of Present Illness Tammie Williams is a 50 y.o. female with prior history of pain in the umbilical area.  Previously exacerbated by her sinus issues with drainage and associated cough.  She is also reports it bothers her during times when she is straining with a hard BM, now more nausea than pain.  She admits to constipation occasionally skipping a day without a bowel movement, never more than one BM/day, and frequent with hard stools.  She tried metamucil, but didn't like it, and quit.  She denies any abdominal cramping, pain at rest, or need to reduce the bulge.  Past Medical History Past Medical History:  Diagnosis Date   Anemia    Anemia    Anxiety    Asthma    GERD (gastroesophageal reflux disease) 01/01/2018   Near syncope 01/01/2018   Palpitations 01/01/2018   Sickle cell trait ([place])    Vitamin D deficiency       Past Surgical History:  Procedure Laterality Date   CESAREAN SECTION     WISDOM TOOTH EXTRACTION      No Known Allergies  Current Outpatient Medications  Medication Sig Dispense Refill   amoxicillin-clavulanate (AUGMENTIN) 875-125 MG tablet SMARTSIG:1 Tablet(s) By Mouth Morning-Night     CALCIUM PO Take by mouth.     citalopram (CELEXA) 10 MG tablet TAKE 1 TABLET(10 MG) BY MOUTH DAILY 30 tablet 2   MAGNESIUM PO Take by mouth.     ondansetron (ZOFRAN-ODT) 8 MG disintegrating tablet Take 1 tablet (8 mg total) by mouth every 8 (eight) hours as needed for nausea or vomiting. 20 tablet 0   OVER THE COUNTER MEDICATION Take 1 packet by mouth daily. Emergen-C  Vitamin C & zinc     predniSONE (DELTASONE) 20 MG tablet Take 20 mg by mouth 2 (two) times daily.     VENTOLIN HFA 108 (90 Base) MCG/ACT inhaler Inhale 1 puff into the lungs every 4 (four) hours as needed for shortness of breath or wheezing.     VITAMIN D PO Take by mouth.     No current  facility-administered medications for this visit.    Family History Family History  Problem Relation Age of Onset   Healthy Mother    Healthy Father    CAD Sister    Stroke Maternal Grandmother    Bone cancer Maternal Grandmother    Cancer Maternal Uncle       Social History Social History   Tobacco Use   Smoking status: Never    Passive exposure: Never   Smokeless tobacco: Never  Vaping Use   Vaping Use: Never used  Substance Use Topics   Alcohol use: No   Drug use: No        Review of Systems  Constitutional:  Positive for malaise/fatigue.  HENT: Negative.    Eyes: Negative.  Negative for blurred vision.  Respiratory:  Negative for cough and wheezing.   Cardiovascular:  Negative for palpitations.  Gastrointestinal:  Positive for abdominal pain and nausea (only with hard BM.). Negative for diarrhea and vomiting.  Genitourinary:  Positive for frequency. Negative for dysuria and urgency.  Skin:  Positive for itching.  Neurological: Negative.   Psychiatric/Behavioral:  Negative for depression. The patient is nervous/anxious.      Physical Exam Blood pressure 125/76, pulse 74, temperature 98.3 F (36.8 C), temperature source Oral, height 5'  5" (1.651 m), weight 223 lb (101.2 kg), last menstrual period 08/04/2021, SpO2 97 %. Last Weight  Most recent update: 08/31/2021  9:14 AM    Weight  101.2 kg (223 lb)             CONSTITUTIONAL: Well developed, and nourished, appropriately responsive and aware without distress.   EYES: Sclera non-icteric.   EARS, NOSE, MOUTH AND THROAT: Mask worn.  Hearing is intact to voice.  NECK: Trachea is midline, and there is no jugular venous distension.  LYMPH NODES:  Lymph nodes in the neck are not enlarged. RESPIRATORY:  Lungs are clear, and breath sounds are equal bilaterally. Normal respiratory effort without pathologic use of accessory muscles. CARDIOVASCULAR: Heart is regular in rate and rhythm. GI: The abdomen is soft,  nontender, and nondistended.  There is no evidence of a hernia sac, there is a transition at the fascial dermal union.  There is slight tenderness around the prominent fascial ring with slight laxity in the dermis, but it is intact and adherent to the fascia circumferentially, there were no palpable masses.  No hernia sac.  I did not appreciate hepatosplenomegaly. There were normal bowel sounds. Mild degree of diastases recti cephalad to this point. MUSCULOSKELETAL:  Symmetrical muscle tone appreciated in all four extremities.    SKIN: Skin turgor is normal. No pathologic skin lesions appreciated.  NEUROLOGIC:  Motor and sensation appear grossly normal.  Cranial nerves are grossly without defect. PSYCH:  Alert and oriented to person, place and time. Affect is appropriate for situation.  Data Reviewed I have personally reviewed what is currently available of the patient's imaging, recent labs and medical records.   Labs:  CBC Latest Ref Rng & Units 10/24/2020 11/30/2019 10/22/2019  WBC 3.4 - 10.8 x10E3/uL 8.9 15.3(H) 8.4  Hemoglobin 11.1 - 15.9 g/dL 13.0 11.3(L) 11.1  Hematocrit 34.0 - 46.6 % 38.1 35.9(L) 35.4  Platelets 150 - 450 x10E3/uL 310 399 413   CMP Latest Ref Rng & Units 10/24/2020 11/30/2019 10/22/2019  Glucose 65 - 99 mg/dL 85 88 89  BUN 6 - 24 mg/dL 7 16 11   Creatinine 0.57 - 1.00 mg/dL 0.70 0.72 0.80  Sodium 134 - 144 mmol/L 143 141 141  Potassium 3.5 - 5.2 mmol/L 4.3 3.7 4.4  Chloride 96 - 106 mmol/L 110(H) 106 105  CO2 20 - 29 mmol/L 23 26 24   Calcium 8.7 - 10.2 mg/dL 9.6 9.2 9.9  Total Protein 6.0 - 8.5 g/dL 6.8 7.2 6.7  Total Bilirubin 0.0 - 1.2 mg/dL 0.3 0.3 0.2  Alkaline Phos 44 - 121 IU/L 122(H) 91 129(H)  AST 0 - 40 IU/L 15 20 20   ALT 0 - 32 IU/L 10 14 13       Imaging:  Within last 24 hrs: No results found.  Assessment    Umbilical pain, intact fascial dermal union in the periumbilical circle, without significant hernia sac or bulge. Mild degree of diastases recti  cephalad to this point.  Chronic constipation.  Patient Active Problem List   Diagnosis Date Noted   Increased frequency of urination 08/31/2021   Menorrhagia 08/31/2021   Vaginitis 08/31/2021   Current moderate episode of major depressive disorder, unspecified whether recurrent ([place]) 10/24/2020   Palpitations 01/01/2018   Near syncope 01/01/2018   Body mass index (bmi) 37.0-37.9, adult 05/01/2017   Iron deficiency anemia 08/17/2014   Dizziness 01/31/2014   URI (upper respiratory infection) 10/09/2013   Adjustment disorder with disturbance of emotion 09/17/2013   Patellar tendinitis  06/18/2011   Swelling of knee joint, left 06/18/2011   Other malaise and fatigue 01/25/2011   Premenstrual tension syndrome 01/25/2011   Sickle cell trait ([facility]) 10/20/2010   Vitamin D deficiency 08/18/2010   Allergic rhinitis 05/23/2010   Reactive airway disease 05/23/2010    Plan    We discussed alleviating the symptoms that seem to be provoking her presentation, her constipation.  Advised to pursue a goal of 25 to 30 g of fiber daily.  Made aware that the majority of this may be through natural sources, but advised to be aware of actual consumption and to ensure minimal consumption by daily supplementation.  Various forms of supplements discussed.  Strongly advised to consume more fluids to ensure adequate hydration, instructed to watch color of urine to determine adequacy of hydration.  Clarity is pursued in urine output, and bowel activity that correlates to significant meal intake.  Patient is to avoid deferring having bowel movements, advised to take the time at the first sign of sensation, typically following meals and in the morning.  Subsequent utilization of MiraLAX to ensure at least daily movement, ideally twice daily bowel movements.  If multiple doses of MiraLAX are necessary utilize them.   We are encouraged to again try the above options, deferring consideration of repair, as I don't expect  it to alleviate her symptoms, and can reevaluate in 3 months or so to see how she is doing.  Face-to-face time spent with the patient and accompanying care providers(if present) was 30 minutes, with more than 50% of the time spent counseling, educating, and coordinating care of the patient.    These notes generated with voice recognition software. I apologize for typographical errors.  Campbell Lerner M.D., FACS 08/31/2021, 9:34 AM

## 2021-09-06 ENCOUNTER — Ambulatory Visit: Payer: 59 | Admitting: Internal Medicine

## 2021-10-31 ENCOUNTER — Ambulatory Visit (INDEPENDENT_AMBULATORY_CARE_PROVIDER_SITE_OTHER): Payer: 59 | Admitting: Internal Medicine

## 2021-10-31 ENCOUNTER — Encounter: Payer: Self-pay | Admitting: Internal Medicine

## 2021-10-31 ENCOUNTER — Other Ambulatory Visit: Payer: Self-pay

## 2021-10-31 VITALS — BP 118/80 | HR 71 | Temp 98.0°F | Ht 64.0 in | Wt 217.8 lb

## 2021-10-31 DIAGNOSIS — D573 Sickle-cell trait: Secondary | ICD-10-CM | POA: Diagnosis not present

## 2021-10-31 DIAGNOSIS — Z6837 Body mass index (BMI) 37.0-37.9, adult: Secondary | ICD-10-CM

## 2021-10-31 DIAGNOSIS — E6609 Other obesity due to excess calories: Secondary | ICD-10-CM

## 2021-10-31 DIAGNOSIS — F411 Generalized anxiety disorder: Secondary | ICD-10-CM | POA: Diagnosis not present

## 2021-10-31 DIAGNOSIS — Z2821 Immunization not carried out because of patient refusal: Secondary | ICD-10-CM

## 2021-10-31 DIAGNOSIS — F331 Major depressive disorder, recurrent, moderate: Secondary | ICD-10-CM

## 2021-10-31 DIAGNOSIS — Z0000 Encounter for general adult medical examination without abnormal findings: Secondary | ICD-10-CM

## 2021-10-31 NOTE — Patient Instructions (Signed)

## 2021-10-31 NOTE — Progress Notes (Signed)
Rich Brave [place]leton,acting as a Education administrator for Maximino Greenland, MD.,have documented all relevant documentation on the behalf of Maximino Greenland, MD,as directed by  Maximino Greenland, MD while in the presence of Maximino Greenland, MD.  This visit occurred during the SARS-CoV-2 public health emergency.  Safety protocols were in place, including screening questions prior to the visit, additional usage of staff PPE, and extensive cleaning of exam room while observing appropriate contact time as indicated for disinfecting solutions.  Subjective:     Patient ID: Tammie Williams , female    DOB: [birth year] , 51 y.o.   MRN: [id number]   Chief Complaint  Patient presents with   Annual Exam    HPI  She is here today for a full physical examination.  She is followed by Dr. Delora Fuel for her pap smears. She did have a pap done in Dec 2022. She states she is no longer taking citalopram, due to her busy schedule. She adds that she is no longer followed by Dr. Darleene Cleaver as well.     Past Medical History:  Diagnosis Date   Anemia    Anemia    Anxiety    Asthma    GERD (gastroesophageal reflux disease) 01/01/2018   Near syncope 01/01/2018   Palpitations 01/01/2018   Sickle cell trait ([place])    Vitamin D deficiency      Family History  Problem Relation Age of Onset   Healthy Mother    Healthy Father    CAD Sister    Stroke Maternal Grandmother    Bone cancer Maternal Grandmother    Cancer Maternal Uncle      Current Outpatient Medications:    CALCIUM PO, Take by mouth., Disp: , Rfl:    MAGNESIUM PO, Take by mouth., Disp: , Rfl:    ondansetron (ZOFRAN-ODT) 8 MG disintegrating tablet, Take 1 tablet (8 mg total) by mouth every 8 (eight) hours as needed for nausea or vomiting., Disp: 20 tablet, Rfl: 0   OVER THE COUNTER MEDICATION, Take 1 packet by mouth daily. Emergen-C  Vitamin C & zinc, Disp: , Rfl:    VENTOLIN HFA 108 (90 Base) MCG/ACT inhaler, Inhale 1 puff into the lungs every 4 (four) hours as needed  for shortness of breath or wheezing., Disp: , Rfl:    VITAMIN D PO, Take by mouth., Disp: , Rfl:    No Known Allergies    Last LMP was Patient's last menstrual period was 10/06/2021.. Negative for Dysmenorrhea. Negative for: breast discharge, breast lump(s), breast pain and breast self exam. Associated symptoms include abnormal vaginal bleeding. Pertinent negatives include abnormal bleeding (hematology), anxiety, decreased libido, depression, difficulty falling sleep, dyspareunia, history of infertility, nocturia, sexual dysfunction, sleep disturbances, urinary incontinence, urinary urgency, vaginal discharge and vaginal itching. Diet regular.The patient states her exercise level is    . The patient's tobacco use is:  Social History   Tobacco Use  Smoking Status Never   Passive exposure: Never  Smokeless Tobacco Never  . She has been exposed to passive smoke. The patient's alcohol use is:  Social History   Substance and Sexual Activity  Alcohol Use No   Review of Systems  Constitutional: Negative.   HENT: Negative.    Eyes: Negative.   Respiratory: Negative.    Cardiovascular: Negative.   Gastrointestinal: Negative.   Endocrine: Negative.   Genitourinary: Negative.   Musculoskeletal: Negative.   Skin: Negative.   Allergic/Immunologic: Negative.   Neurological: Negative.   Hematological: Negative.  Psychiatric/Behavioral:  Positive for dysphoric mood. The patient is nervous/anxious.     Today's Vitals   10/31/21 0919  BP: 118/80  Pulse: 71  Temp: 98 F (36.7 C)  Weight: 217 lb 12.8 oz (98.8 kg)  Height: $Remove'5\' 4"'dGOFwFL$  (1.626 m)  PainSc: 0-No pain   Body mass index is 37.39 kg/m.  Wt Readings from Last 3 Encounters:  10/31/21 217 lb 12.8 oz (98.8 kg)  08/31/21 223 lb (101.2 kg)  06/22/21 218 lb 6.4 oz (99.1 kg)     Objective:  Physical Exam Vitals and nursing note reviewed.  Constitutional:      Appearance: Normal appearance. She is obese.  HENT:     Head:  Normocephalic and atraumatic.     Right Ear: Tympanic membrane, ear canal and external ear normal.     Left Ear: Tympanic membrane, ear canal and external ear normal.     Nose:     Comments: Masked     Mouth/Throat:     Comments: Masked  Eyes:     Extraocular Movements: Extraocular movements intact.     Conjunctiva/sclera: Conjunctivae normal.     Pupils: Pupils are equal, round, and reactive to light.  Cardiovascular:     Rate and Rhythm: Normal rate and regular rhythm.     Pulses: Normal pulses.     Heart sounds: Normal heart sounds.  Pulmonary:     Effort: Pulmonary effort is normal.     Breath sounds: Normal breath sounds.  Chest:  Breasts:    Tanner Score is 5.     Right: Normal.     Left: Normal.  Abdominal:     General: Bowel sounds are normal.     Palpations: Abdomen is soft.     Comments: Obese, soft  Genitourinary:    Comments: deferred Musculoskeletal:        General: Normal range of motion.     Cervical back: Normal range of motion and neck supple.  Skin:    General: Skin is warm and dry.  Neurological:     General: No focal deficit present.     Mental Status: She is alert and oriented to person, place, and time.  Psychiatric:        Mood and Affect: Mood normal.        Behavior: Behavior normal.        Assessment And Plan:     1. Encounter for general adult medical examination w/o abnormal findings Comments: A full exam was performed. Importance of monthly self breast exams was discussed with the patient. PATIENT IS ADVISED TO GET 30-45 MINUTES REGULAR EXERCISE NO LESS THAN FOUR TO FIVE DAYS PER WEEK - BOTH WEIGHTBEARING EXERCISES AND AEROBIC ARE RECOMMENDED.  PATIENT IS ADVISED TO FOLLOW A HEALTHY DIET WITH AT LEAST SIX FRUITS/VEGGIES PER DAY, DECREASE INTAKE OF RED MEAT, AND TO INCREASE FISH INTAKE TO TWO DAYS PER WEEK.  MEATS/FISH SHOULD NOT BE FRIED, BAKED OR BROILED IS PREFERABLE.  IT IS ALSO IMPORTANT TO CUT BACK ON YOUR SUGAR INTAKE. PLEASE AVOID  ANYTHING WITH ADDED SUGAR, CORN SYRUP OR OTHER SWEETENERS. IF YOU MUST USE A SWEETENER, YOU CAN TRY STEVIA. IT IS ALSO IMPORTANT TO AVOID ARTIFICIALLY SWEETENERS AND DIET BEVERAGES. LASTLY, I SUGGEST WEARING SPF 50 SUNSCREEN ON EXPOSED PARTS AND ESPECIALLY WHEN IN THE DIRECT SUNLIGHT FOR AN EXTENDED PERIOD OF TIME.  PLEASE AVOID FAST FOOD RESTAURANTS AND INCREASE YOUR WATER INTAKE.  - CBC - CMP14+EGFR - Lipid panel - Hemoglobin A1c - Iron,  TIBC and Ferritin Panel  2. Moderate episode of recurrent major depressive disorder ([place]) Comments: Chronic, now off of meds. She does agree to referral for therapy. I will refer her to [place].   3. GAD (generalized anxiety disorder) Comments: Chronic, would likely benefit from magnesium supplementation. I will check Mg level today.  - Ambulatory referral to Psychology - Magnesium  4. Sickle cell trait (HCC) Comments: Chronic, stable. Encouraged to stay well hydrated.   5. Class 2 obesity due to excess calories without serious comorbidity with body mass index (BMI) of 37.0 to 37.9 in adult Comments: She is encouraged to strive for BMI <30 to decrease cardiac risk. Advised to aim for at least 150 minutes of exercise per week.   6. Influenza vaccination declined  7. COVID-19 vaccination declined  Patient was given opportunity to ask questions. Patient verbalized understanding of the plan and was able to repeat key elements of the plan. All questions were answered to their satisfaction.   I, Maximino Greenland, MD, have reviewed all documentation for this visit. The documentation on 10/31/21 for the exam, diagnosis, procedures, and orders are all accurate and complete.   THE PATIENT IS ENCOURAGED TO PRACTICE SOCIAL DISTANCING DUE TO THE COVID-19 PANDEMIC.

## 2021-11-01 ENCOUNTER — Telehealth: Payer: Self-pay

## 2021-11-01 ENCOUNTER — Encounter: Payer: Self-pay | Admitting: Internal Medicine

## 2021-11-01 ENCOUNTER — Other Ambulatory Visit: Payer: Self-pay | Admitting: Internal Medicine

## 2021-11-01 LAB — CMP14+EGFR
ALT: 11 IU/L (ref 0–32)
AST: 16 IU/L (ref 0–40)
Albumin/Globulin Ratio: 1.6 (ref 1.2–2.2)
Albumin: 4.4 g/dL (ref 3.8–4.8)
Alkaline Phosphatase: 127 IU/L — ABNORMAL HIGH (ref 44–121)
BUN/Creatinine Ratio: 11 (ref 9–23)
BUN: 8 mg/dL (ref 6–24)
Bilirubin Total: 0.4 mg/dL (ref 0.0–1.2)
CO2: 20 mmol/L (ref 20–29)
Calcium: 9.5 mg/dL (ref 8.7–10.2)
Chloride: 104 mmol/L (ref 96–106)
Creatinine, Ser: 0.7 mg/dL (ref 0.57–1.00)
Globulin, Total: 2.7 g/dL (ref 1.5–4.5)
Glucose: 96 mg/dL (ref 70–99)
Potassium: 4.4 mmol/L (ref 3.5–5.2)
Sodium: 137 mmol/L (ref 134–144)
Total Protein: 7.1 g/dL (ref 6.0–8.5)
eGFR: 107 mL/min/{1.73_m2} (ref >59)

## 2021-11-01 LAB — IRON,TIBC AND FERRITIN PANEL
Ferritin: 7 ng/mL — ABNORMAL LOW (ref 15–150)
Iron Saturation: 5 % — CL (ref 15–55)
Iron: 19 ug/dL — ABNORMAL LOW (ref 27–159)
Total Iron Binding Capacity: 414 ug/dL (ref 250–450)
UIBC: 395 ug/dL (ref 131–425)

## 2021-11-01 LAB — CBC
Hematocrit: 29.9 % — ABNORMAL LOW (ref 34.0–46.6)
Hemoglobin: 9.6 g/dL — ABNORMAL LOW (ref 11.1–15.9)
MCH: 22.2 pg — ABNORMAL LOW (ref 26.6–33.0)
MCHC: 32.1 g/dL (ref 31.5–35.7)
MCV: 69 fL — ABNORMAL LOW (ref 79–97)
Platelets: 413 10*3/uL (ref 150–450)
RBC: 4.33 x10E6/uL (ref 3.77–5.28)
RDW: 16.7 % — ABNORMAL HIGH (ref 11.7–15.4)
WBC: 7.8 10*3/uL (ref 3.4–10.8)

## 2021-11-01 LAB — LIPID PANEL
Chol/HDL Ratio: 3.9 ratio (ref 0.0–4.4)
Cholesterol, Total: 212 mg/dL — ABNORMAL HIGH (ref 100–199)
HDL: 54 mg/dL (ref >39)
LDL Chol Calc (NIH): 139 mg/dL — ABNORMAL HIGH (ref 0–99)
Triglycerides: 107 mg/dL (ref 0–149)
VLDL Cholesterol Cal: 19 mg/dL (ref 5–40)

## 2021-11-01 LAB — HEMOGLOBIN A1C
Est. average glucose Bld gHb Est-mCnc: 128 mg/dL
Hgb A1c MFr Bld: 6.1 % — ABNORMAL HIGH (ref 4.8–5.6)

## 2021-11-01 LAB — MAGNESIUM: Magnesium: 2 mg/dL (ref 1.6–2.3)

## 2021-11-01 MED ORDER — FUSION PLUS PO CAPS: 1.0000 | ORAL_CAPSULE | Freq: Every day | ORAL | 3 refills | Status: DC

## 2021-11-01 NOTE — Telephone Encounter (Signed)
The pt was notified that her physicians results form has been faxed and it will be emailed to the pt's email on file with the office.

## 2022-03-06 ENCOUNTER — Ambulatory Visit (HOSPITAL_COMMUNITY)
Admission: EM | Admit: 2022-03-06 | Discharge: 2022-03-06 | Disposition: A | Discharge status: Home / Self Care | Payer: 59

## 2022-03-06 ENCOUNTER — Encounter (HOSPITAL_COMMUNITY): Payer: Self-pay | Admitting: *Deleted

## 2022-03-06 DIAGNOSIS — M62838 Other muscle spasm: Secondary | ICD-10-CM | POA: Diagnosis not present

## 2022-03-06 MED ORDER — CYCLOBENZAPRINE HCL 5 MG PO TABS: 5.0000 mg | ORAL_TABLET | Freq: Three times a day (TID) | ORAL | 0 refills | Status: AC | PRN

## 2022-03-06 MED ORDER — KETOROLAC TROMETHAMINE 30 MG/ML IJ SOLN
30.0000 mg | Freq: Once | INTRAMUSCULAR | Status: AC
  Administered 2022-03-06: 30 mg via INTRAMUSCULAR

## 2022-03-06 MED ORDER — KETOROLAC TROMETHAMINE 30 MG/ML IJ SOLN
INTRAMUSCULAR | Status: AC
  Filled 2022-03-06: qty 1

## 2022-03-06 NOTE — ED Provider Notes (Signed)
[facility]    CSN: [id number] Arrival date & time: 03/06/22  1715      History   Chief Complaint Chief Complaint  Patient presents with   Back Pain   Neck Pain    HPI Tammie Williams is a 51 y.o. female.   Pt complains of right sided neck pain that started about 2-3 weeks ago after lifting about 30 bottles of water.  She reports pain around her shoulder blade as well.  She has some numbness to her right hand.  She has been seeing a chiropracter, has had three visits, with no relief.  She has been taking motrin without relief.  Pain is worse when turning her head to the right.  Pt also has a h/o anxiety and reports she has felt more anxious than normal.  She has chest tightness and reports it feels sore to the touch.  She is worried it is related to her heart.  Recently started back on her SSRI, about one week ago.  She does follow with her PCP for this problem.    Past Medical History:  Diagnosis Date   Anemia    Anemia    Anxiety    Asthma    GERD (gastroesophageal reflux disease) 01/01/2018   Near syncope 01/01/2018   Palpitations 01/01/2018   Sickle cell trait (HCC)    Vitamin D deficiency     Patient Active Problem List   Diagnosis Date Noted   Increased frequency of urination 08/31/2021   Menorrhagia 08/31/2021   Vaginitis 08/31/2021   Constipation 08/31/2021   Diastasis recti 08/31/2021   Umbilical hernia without obstruction and without gangrene 08/31/2021   Current moderate episode of major depressive disorder, unspecified whether recurrent (HCC) 10/24/2020   Palpitations 01/01/2018   Near syncope 01/01/2018   Body mass index (BMI) of 37.0 to 37.9 in adult 05/01/2017   Iron deficiency anemia 08/17/2014   Dizziness 01/31/2014   URI (upper respiratory infection) 10/09/2013   Adjustment disorder with disturbance of emotion 09/17/2013   Patellar tendinitis 06/18/2011   Swelling of knee joint, left 06/18/2011   Other malaise and fatigue 01/25/2011    Premenstrual tension syndrome 01/25/2011   Sickle cell trait (HCC) 10/20/2010   Vitamin D deficiency 08/18/2010   Allergic rhinitis 05/23/2010   Reactive airway disease 05/23/2010    Past Surgical History:  Procedure Laterality Date   CESAREAN SECTION     WISDOM TOOTH EXTRACTION      OB History     Gravida  4   Para  1   Term  1   Preterm      AB  3   Living  1      SAB  1   IAB  1   Ectopic  1   Multiple      Live Births               Home Medications    Prior to Admission medications   Medication Sig Start Date End Date Taking? Authorizing Provider  Ascorbic Acid (VITAMIN C PO) Take by mouth.   Yes [provider]  CALCIUM PO Take by mouth.   Yes [provider]  CITALOPRAM HYDROBROMIDE PO Take by mouth.   Yes [provider]  cyclobenzaprine (FLEXERIL) 5 MG tablet Take 1 tablet (5 mg total) by mouth 3 (three) times daily as needed for up to 10 days for muscle spasms. 03/06/22 03/16/22 Yes Ward, Tylene Fantasia, PA-C  Iron-FA-B Cmp-C-Biot-Probiotic (FUSION  PLUS) CAPS Take 1 capsule by mouth daily. 11/01/21  Yes Dorothyann PengSanders, Robyn, MD  MAGNESIUM PO Take by mouth.   Yes [provider]  Multiple Vitamin (MULTIVITAMIN PO) Take by mouth.   Yes [provider]  UNABLE TO FIND Med Name: Hair, skin, nails supplement   Yes [provider]  VITAMIN D PO Take by mouth.   Yes [provider]  ondansetron (ZOFRAN-ODT) 8 MG disintegrating tablet Take 1 tablet (8 mg total) by mouth every 8 (eight) hours as needed for nausea or vomiting. 03/20/21   Wallis BambergMani, Mario, PA-C  OVER THE COUNTER MEDICATION Take 1 packet by mouth daily. Emergen-C  Vitamin C & zinc    [provider]  VENTOLIN HFA 108 (90 Base) MCG/ACT inhaler Inhale 1 puff into the lungs every 4 (four) hours as needed for shortness of breath or wheezing. 11/26/19   [provider]    Family History Family History  Problem Relation Age of Onset    Healthy Mother    Healthy Father    CAD Sister    Stroke Maternal Grandmother    Bone cancer Maternal Grandmother    Cancer Maternal Uncle     Social History Social History   Tobacco Use   Smoking status: Never    Passive exposure: Never   Smokeless tobacco: Never  Vaping Use   Vaping Use: Never used  Substance Use Topics   Alcohol use: Not Currently    Comment: very rare   Drug use: Never     Allergies   Patient has no known allergies.   Review of Systems Review of Systems  Constitutional:  Negative for chills and fever.  HENT:  Negative for ear pain and sore throat.   Eyes:  Negative for pain and visual disturbance.  Respiratory:  Positive for chest tightness. Negative for cough and shortness of breath.   Cardiovascular:  Negative for chest pain and palpitations.  Gastrointestinal:  Negative for abdominal pain and vomiting.  Genitourinary:  Negative for dysuria and hematuria.  Musculoskeletal:  Positive for neck pain. Negative for arthralgias and back pain.  Skin:  Negative for color change and rash.  Neurological:  Negative for seizures and syncope.  Psychiatric/Behavioral:  The patient is nervous/anxious.   All other systems reviewed and are negative.   Physical Exam Triage Vital Signs ED Triage Vitals  Enc Vitals Group     BP 03/06/22 1741 124/64     Pulse Rate 03/06/22 1741 69     Resp 03/06/22 1741 20     Temp 03/06/22 1741 97.8 F (36.6 C)     Temp Source 03/06/22 1741 Oral     SpO2 03/06/22 1741 98 %     Weight --      Height --      Head Circumference --      Peak Flow --      Pain Score 03/06/22 1743 7     Pain Loc --      Pain Edu? --      Excl. in GC? --    No data found.  Updated Vital Signs BP 124/64   Pulse 69   Temp 97.8 F (36.6 C) (Oral)   Resp 20   LMP 02/14/2022 (Approximate)   SpO2 98%   Visual Acuity Right Eye Distance:   Left Eye Distance:   Bilateral Distance:    Right Eye Near:   Left Eye Near:    Bilateral  Near:  Physical Exam Vitals and nursing note reviewed.  Constitutional:      General: She is not in acute distress.    Appearance: She is well-developed.  HENT:     Head: Normocephalic and atraumatic.  Eyes:     Conjunctiva/sclera: Conjunctivae normal.  Cardiovascular:     Rate and Rhythm: Normal rate and regular rhythm.     Heart sounds: No murmur heard. Pulmonary:     Effort: Pulmonary effort is normal. No respiratory distress.     Breath sounds: Normal breath sounds.  Chest:     Comments: Reproducible chest soreness on exam Abdominal:     Palpations: Abdomen is soft.     Tenderness: There is no abdominal tenderness.  Musculoskeletal:        General: No swelling.       Arms:     Cervical back: Neck supple.     Comments: Normal cervical ROM, normal right shoulder ROM.   Skin:    General: Skin is warm and dry.     Capillary Refill: Capillary refill takes less than 2 seconds.  Neurological:     Mental Status: She is alert.  Psychiatric:        Mood and Affect: Mood normal.     UC Treatments / Results  Labs (all labs ordered are listed, but only abnormal results are displayed) Labs Reviewed - No data to display  EKG   Radiology No results found.  Procedures Procedures (including critical care time)  Medications Ordered in UC Medications  ketorolac (TORADOL) 30 MG/ML injection 30 mg (has no administration in time range)    Initial Impression / Assessment and Plan / UC Course  I have reviewed the triage vital signs and the nursing notes.  Pertinent labs & imaging results that were available during my care of the patient were reviewed by me and considered in my medical decision making (see chart for details).     Cervical spasm, flexeril prescribed. Toradol given in clinic today.  Supportive care discussed.  EKG normal, reassurance given.  Advised pt to follow up with PCP regarding anxiety and to continue with SSRI.  Final Clinical Impressions(s) / UC  Diagnoses   Final diagnoses:  Neck muscle spasm     Discharge Instructions      Take muscle relaxer as needed for muscle spasm Recommend heat, gentle stretching, and gentle massage Can continue with ibuprofen as needed Follow up with primary care physician regarding anxiety   ED Prescriptions     Medication Sig Dispense Auth. Provider   cyclobenzaprine (FLEXERIL) 5 MG tablet Take 1 tablet (5 mg total) by mouth 3 (three) times daily as needed for up to 10 days for muscle spasms. 30 tablet Ward, Tylene Fantasia, PA-C      PDMP not reviewed this encounter.   Ward, Tylene Fantasia, PA-C 03/06/22 1824

## 2022-03-06 NOTE — Discharge Instructions (Addendum)
Take muscle relaxer as needed for muscle spasm Recommend heat, gentle stretching, and gentle massage Can continue with ibuprofen as needed Follow up with primary care physician regarding anxiety

## 2022-03-06 NOTE — ED Triage Notes (Signed)
C/O back pain and neck pain; states approx 2-3 wks ago was lifting 40 bottles of water, then started with right posterior and lateral neck pain, and right upper and middle back pain. Has had 3 chiropractic appts without relief. C/O intermittent tingling to RUE. Also reports anxiety and intermittent left chest pains "for a while" since "dealing with some stressors". Denies any chest pain at this time.

## 2022-04-02 ENCOUNTER — Ambulatory Visit (INDEPENDENT_AMBULATORY_CARE_PROVIDER_SITE_OTHER): Payer: 59 | Admitting: Internal Medicine

## 2022-04-02 ENCOUNTER — Encounter: Payer: Self-pay | Admitting: Internal Medicine

## 2022-04-02 VITALS — BP 124/80 | HR 79 | Temp 97.7°F | Ht 63.8 in | Wt 210.8 lb

## 2022-04-02 DIAGNOSIS — D500 Iron deficiency anemia secondary to blood loss (chronic): Secondary | ICD-10-CM

## 2022-04-02 DIAGNOSIS — Z6836 Body mass index (BMI) 36.0-36.9, adult: Secondary | ICD-10-CM | POA: Diagnosis not present

## 2022-04-02 DIAGNOSIS — R7309 Other abnormal glucose: Secondary | ICD-10-CM | POA: Diagnosis not present

## 2022-04-02 DIAGNOSIS — Z7184 Encounter for health counseling related to travel: Secondary | ICD-10-CM

## 2022-04-02 MED ORDER — SCOPOLAMINE 1 MG/3DAYS TD PT72: 1.0000 | MEDICATED_PATCH | TRANSDERMAL | 0 refills | Status: DC

## 2022-04-02 NOTE — Progress Notes (Signed)
Tammie Williams,acting as a Neurosurgeon for Gwynneth Aliment, MD.,have documented all relevant documentation on the behalf of Gwynneth Aliment, MD,as directed by  Gwynneth Aliment, MD while in the presence of Gwynneth Aliment, MD.  This visit occurred during the SARS-CoV-2 public health emergency.  Safety protocols were in place, including screening questions prior to the visit, additional usage of staff PPE, and extensive cleaning of exam room while observing appropriate contact time as indicated for disinfecting solutions.  Subjective:     Patient ID: Tammie Williams , female    DOB: Apr 24, 1971 , 51 y.o.   MRN: [id number]   Chief Complaint  Patient presents with   medication   abnormal glucose    HPI  Patient presents today because is going on a cruise soon and she would like some patches to help with motion sickness. She has no other concerns at this time. She would like to also do her regular labwork.      Past Medical History:  Diagnosis Date   Anemia    Anemia    Anxiety    Asthma    GERD (gastroesophageal reflux disease) 01/01/2018   Near syncope 01/01/2018   Palpitations 01/01/2018   Sickle cell trait (HCC)    Vitamin D deficiency      Family History  Problem Relation Age of Onset   Healthy Mother    Healthy Father    CAD Sister    Stroke Maternal Grandmother    Bone cancer Maternal Grandmother    Cancer Maternal Uncle      Current Outpatient Medications:    Ascorbic Acid (VITAMIN C PO), Take by mouth., Disp: , Rfl:    CALCIUM PO, Take by mouth., Disp: , Rfl:    CITALOPRAM HYDROBROMIDE PO, Take by mouth., Disp: , Rfl:    Iron-FA-B Cmp-C-Biot-Probiotic (FUSION PLUS) CAPS, Take 1 capsule by mouth daily., Disp: 30 capsule, Rfl: 3   MAGNESIUM PO, Take by mouth., Disp: , Rfl:    Multiple Vitamin (MULTIVITAMIN PO), Take by mouth., Disp: , Rfl:    ondansetron (ZOFRAN-ODT) 8 MG disintegrating tablet, Take 1 tablet (8 mg total) by mouth every 8 (eight) hours as needed for  nausea or vomiting., Disp: 20 tablet, Rfl: 0   scopolamine (TRANSDERM SCOP, 1.5 MG,) 1 MG/3DAYS, Place 1 patch (1.5 mg total) onto the skin every 3 (three) days., Disp: 4 patch, Rfl: 0   UNABLE TO FIND, Med Name: Hair, skin, nails supplement, Disp: , Rfl:    VENTOLIN HFA 108 (90 Base) MCG/ACT inhaler, Inhale 1 puff into the lungs every 4 (four) hours as needed for shortness of breath or wheezing., Disp: , Rfl:    VITAMIN D PO, Take by mouth., Disp: , Rfl:    OVER THE COUNTER MEDICATION, Take 1 packet by mouth daily. Emergen-C  Vitamin C & zinc (Patient not taking: Reported on 04/02/2022), Disp: , Rfl:    No Known Allergies   Review of Systems  Constitutional: Negative.   Respiratory: Negative.    Cardiovascular: Negative.   Gastrointestinal: Negative.   Neurological: Negative.   Psychiatric/Behavioral: Negative.       Today's Vitals   04/02/22 1439  BP: 124/80  Pulse: 79  Temp: 97.7 F (36.5 C)  Weight: 210 lb 12.8 oz (95.6 kg)  Height: 5' 3.8" (1.621 m)  PainSc: 0-No pain   Body mass index is 36.41 kg/m.  Wt Readings from Last 3 Encounters:  04/02/22 210 lb 12.8 oz (95.6 kg)  10/31/21 217 lb 12.8 oz (98.8 kg)  08/31/21 223 lb (101.2 kg)     Objective:  Physical Exam Vitals and nursing note reviewed.  Constitutional:      Appearance: Normal appearance.  HENT:     Head: Normocephalic and atraumatic.  Eyes:     Extraocular Movements: Extraocular movements intact.  Cardiovascular:     Rate and Rhythm: Normal rate and regular rhythm.     Heart sounds: Normal heart sounds.  Pulmonary:     Effort: Pulmonary effort is normal.     Breath sounds: Normal breath sounds.  Musculoskeletal:     Cervical back: Normal range of motion.  Skin:    General: Skin is warm.  Neurological:     General: No focal deficit present.     Mental Status: She is alert.  Psychiatric:        Mood and Affect: Mood normal.        Behavior: Behavior normal.      Assessment And Plan:     1.  Abnormal glucose Comments: Her a1c has been elevated in the past. I will recheck this today.  She is encouraged to limit her intake of sweetened beverages and desserts/candies.  - Hemoglobin A1c  2. Encounter for health counseling related to travel Comments: She was given rx Transderm-Scop and instructed on how to use the patch.   3. Class 2 severe obesity due to excess calories with serious comorbidity and body mass index (BMI) of 36.0 to 36.9 in adult [facility]) Comments: She is encouraged to aim for at least 150 minutes of exercise per week, while striving for BMI<30 to decrease cardiac risk.   4. Iron deficiency anemia due to chronic blood loss Comments: I will check labs as below and supplemnet as needed. She is encouraged to f/u with GYN regarding menorrhagia.  - Iron, TIBC and Ferritin Panel - CBC no Diff   Patient was given opportunity to ask questions. Patient verbalized understanding of the plan and was able to repeat key elements of the plan. All questions were answered to their satisfaction.   I, Gwynneth Aliment, MD, have reviewed all documentation for this visit. The documentation on 04/02/22 for the exam, diagnosis, procedures, and orders are all accurate and complete.   IF YOU HAVE BEEN REFERRED TO A SPECIALIST, IT MAY TAKE 1-2 WEEKS TO SCHEDULE/PROCESS THE REFERRAL. IF YOU HAVE NOT HEARD FROM US/SPECIALIST IN TWO WEEKS, PLEASE GIVE [place] A CALL AT [phone] X [phone].   THE PATIENT IS ENCOURAGED TO PRACTICE SOCIAL DISTANCING DUE TO THE COVID-19 PANDEMIC.

## 2022-04-02 NOTE — Patient Instructions (Signed)
Motion Sickness Motion sickness is a feeling of being dizzy or like you may vomit (nauseous). It can happen when you travel in a boat, car, or airplane. It can also happen when you go on an amusement park ride. The symptoms usually go away when the motion stops. There are things that you can do to help prevent motion sickness. What are the causes? This condition may be caused when part of the inner ear (semicircular canal) is stimulated too much. The two canals help you keep your balance by sending signals to your brain about movement. You can get motion sickness if: The system that helps you move normally and keep your balance is stimulated too much from the motion of your body. Your brain gets mixed signals from the motion sensors in your body. The effects of motion sickness may be increased by: Stress. Not having enough water in the body (dehydration). Other illnesses. Drinking too much alcohol. What increases the risk? This condition is more likely to develop in: Children who are 2-12 years old. Women, especially those who are pregnant or taking birth control pills. People who get migraine headaches. People who have inner ear disorders. People who take certain medicines. What are the signs or symptoms? Symptoms of this condition include: Feeling like you may vomit, and vomiting. Feeling dizzy. Pale skin. Sweating. Trouble thinking. Belly (abdomen) pain. Being unsteady when walking. The symptoms usually get better after the motion or traveling stops but may last for hours or days. How is this treated? For most people, symptoms fade quickly after the motion stops. Avoiding certain things or using certain methods before or during travel can keep you from getting motion sickness. Your doctor may suggest or prescribe medicine to keep you from getting motion sickness. Medicine may be in the form of a patch that is placed behind your ear. Follow these instructions at  home: Medicines Take or use over-the-counter and prescription medicines only as told by your doctor. If you use a motion sickness patch, wash your hands right after you put the patch on. Eating and drinking  Drink enough fluid to keep your pee (urine) pale yellow. While having motion sickness, take small sips of liquids often. Keeping enough fluid in your body may help relieve or prevent problems. Do not eat large meals before or during travel. If you are going a long distance, eat small, plain meals. Do not drink alcohol before or during travel. When riding in a moving vehicle: Sit where there is the least amount of motion. On an airplane, sit near the wing. Lie back in your seat if you can. On a boat, sit near the middle. In a car, sit in the front seat, not the back seat. Breathe slowly and deeply. Do not read or focus on nearby things such as your phone. Try watching the horizon or a distant object. This is especially helpful when you are on a boat. In a car, ride in the front seat and look out the front window. Get some fresh air if you can. Open a window when you are riding in a car. General instructions If possible, avoid activities that cause motion sickness. Do not smoke before or during travel. Avoid areas where people are smoking. Plan ahead for travel. Ask your doctor if you should take medicines to help prevent motion sickness. Where to find more information Centers for Disease Control and Prevention: wwwnc.cdc.gov Contact a doctor if: You faint. You still vomit or feel like you may vomit after   24 hours. You feel very dizzy or light-headed when you stand up. You have a fever. Get help right away if: You have trouble breathing. You have very bad chest pain. You have very bad belly pain. You have a very bad headache. You have trouble speaking. You feel weak or lose feeling (have numbness) on one side of your body. These symptoms may be an emergency. Get help right  away. Call your local emergency services (911 in the [place] Do not wait to see if the symptoms will go away. Do not drive yourself to the hospital. Summary Motion sickness can happen when you travel in a boat, car, or airplane, or when you go on an amusement park ride. Problems usually go away when the motion stops. Plan ahead for travel. Ask your doctor if you should take medicines to help prevent motion sickness. This information is not intended to replace advice given to you by your health care provider. Make sure you discuss any questions you have with your health care provider. Document Revised: 08/23/2020 Document Reviewed: 08/23/2020 Elsevier Patient Education  2023 Elsevier Inc.  

## 2022-04-03 LAB — CBC
Hematocrit: 39.4 % (ref 34.0–46.6)
Hemoglobin: 12.7 g/dL (ref 11.1–15.9)
MCH: 26.7 pg (ref 26.6–33.0)
MCHC: 32.2 g/dL (ref 31.5–35.7)
MCV: 83 fL (ref 79–97)
Platelets: 381 10*3/uL (ref 150–450)
RBC: 4.76 x10E6/uL (ref 3.77–5.28)
RDW: 15.8 % — ABNORMAL HIGH (ref 11.7–15.4)
WBC: 9.3 10*3/uL (ref 3.4–10.8)

## 2022-04-03 LAB — HEMOGLOBIN A1C
Est. average glucose Bld gHb Est-mCnc: 114 mg/dL
Hgb A1c MFr Bld: 5.6 % (ref 4.8–5.6)

## 2022-04-03 LAB — IRON,TIBC AND FERRITIN PANEL
Ferritin: 28 ng/mL (ref 15–150)
Iron Saturation: 17 % (ref 15–55)
Iron: 61 ug/dL (ref 27–159)
Total Iron Binding Capacity: 349 ug/dL (ref 250–450)
UIBC: 288 ug/dL (ref 131–425)

## 2022-04-19 ENCOUNTER — Ambulatory Visit: Payer: 59 | Admitting: Internal Medicine

## 2022-07-29 ENCOUNTER — Ambulatory Visit (HOSPITAL_COMMUNITY)
Admission: EM | Admit: 2022-07-29 | Discharge: 2022-07-29 | Disposition: A | Discharge status: Home / Self Care | Payer: 59 | Attending: Family Medicine | Admitting: Family Medicine

## 2022-07-29 ENCOUNTER — Encounter (HOSPITAL_COMMUNITY): Payer: Self-pay

## 2022-07-29 DIAGNOSIS — R102 Pelvic and perineal pain: Secondary | ICD-10-CM

## 2022-07-29 LAB — POC URINE PREG, ED: Preg Test, Ur: NEGATIVE

## 2022-07-29 LAB — POCT URINALYSIS DIPSTICK, ED / UC
Bilirubin Urine: NEGATIVE
Glucose, UA: NEGATIVE mg/dL
Hgb urine dipstick: NEGATIVE
Ketones, ur: NEGATIVE mg/dL
Leukocytes,Ua: NEGATIVE
Nitrite: NEGATIVE
Protein, ur: NEGATIVE mg/dL
Specific Gravity, Urine: 1.015 (ref 1.005–1.030)
Urobilinogen, UA: 0.2 mg/dL (ref 0.0–1.0)
pH: 7 (ref 5.0–8.0)

## 2022-07-29 MED ORDER — ONDANSETRON 4 MG PO TBDP: 4.0000 mg | ORAL_TABLET | Freq: Three times a day (TID) | ORAL | 0 refills | Status: DC | PRN

## 2022-07-29 MED ORDER — KETOROLAC TROMETHAMINE 30 MG/ML IJ SOLN
30.0000 mg | Freq: Once | INTRAMUSCULAR | Status: AC
Start: 2022-07-29 — End: 2022-07-29
  Administered 2022-07-29: 30 mg via INTRAMUSCULAR

## 2022-07-29 MED ORDER — KETOROLAC TROMETHAMINE 30 MG/ML IJ SOLN
INTRAMUSCULAR | Status: AC
  Filled 2022-07-29: qty 1

## 2022-07-29 MED ORDER — IBUPROFEN 800 MG PO TABS: 800.0000 mg | ORAL_TABLET | Freq: Three times a day (TID) | ORAL | 0 refills | Status: DC | PRN

## 2022-07-29 NOTE — ED Provider Notes (Signed)
[facility]    CSN: [id number] Arrival date & time: 07/29/22  1144      History   Chief Complaint Chief Complaint  Patient presents with   Abdominal Cramping   Constipation    HPI Tammie Williams is a 51 y.o. female.    Abdominal Cramping  Constipation  Here for right lower quadrant abdominal cramping that is been going on for about 2 weeks.  She has seen her GYN.  They did an ultrasound and that showed fibroids.  She is concerned that something else is going on since her menstrual cycle is about 1 week late.  She did have 1 episode of bleeding right after urination about 1 week ago.  She is having some hot flashes.  No fever or chills noted and no vomiting, but she has had some nausea.  She does have a history of an ectopic pregnancy and expresses concern.  She has had some vaginal discharge and does have a new sexual partner  Past Medical History:  Diagnosis Date   Anemia    Anemia    Anxiety    Asthma    GERD (gastroesophageal reflux disease) 01/01/2018   Near syncope 01/01/2018   Palpitations 01/01/2018   Sickle cell trait ([place])    Vitamin D deficiency     Patient Active Problem List   Diagnosis Date Noted   Class 2 severe obesity due to excess calories with serious comorbidity and body mass index (BMI) of 36.0 to 36.9 in adult ([place]) 04/02/2022   Abnormal glucose 04/02/2022   Increased frequency of urination 08/31/2021   Menorrhagia 08/31/2021   Vaginitis 08/31/2021   Constipation 08/31/2021   Diastasis recti 24/23/5361   Umbilical hernia without obstruction and without gangrene 08/31/2021   Current moderate episode of major depressive disorder, unspecified whether recurrent ([place]) 10/24/2020   Palpitations 01/01/2018   Near syncope 01/01/2018   Body mass index (BMI) of 37.0 to 37.9 in adult 05/01/2017   Iron deficiency anemia 08/17/2014   Dizziness 01/31/2014   URI (upper respiratory infection) 10/09/2013   Adjustment disorder with disturbance of  emotion 09/17/2013   Patellar tendinitis 06/18/2011   Swelling of knee joint, left 06/18/2011   Other malaise and fatigue 01/25/2011   Premenstrual tension syndrome 01/25/2011   Sickle cell trait ([place]) 10/20/2010   Vitamin D deficiency 08/18/2010   Allergic rhinitis 05/23/2010   Reactive airway disease 05/23/2010    Past Surgical History:  Procedure Laterality Date   CESAREAN SECTION     WISDOM TOOTH EXTRACTION      OB History     Gravida  4   Para  1   Term  1   Preterm      AB  3   Living  1      SAB  1   IAB  1   Ectopic  1   Multiple      Live Births               Home Medications    Prior to Admission medications   Medication Sig Start Date End Date Taking? Authorizing Provider  ibuprofen (ADVIL) 800 MG tablet Take 1 tablet (800 mg total) by mouth every 8 (eight) hours as needed (pain). 07/29/22  Yes Barrett Henle, MD  ondansetron (ZOFRAN-ODT) 4 MG disintegrating tablet Take 1 tablet (4 mg total) by mouth every 8 (eight) hours as needed for nausea or vomiting. 07/29/22  Yes Barrett Henle, MD  Ascorbic Acid (  VITAMIN C PO) Take by mouth.    [provider]  CALCIUM PO Take by mouth.    [provider]  CITALOPRAM HYDROBROMIDE PO Take by mouth.    [provider]  Iron-FA-B Cmp-C-Biot-Probiotic (FUSION PLUS) CAPS Take 1 capsule by mouth daily. 11/01/21   Dorothyann Peng, MD  MAGNESIUM PO Take by mouth.    [provider]  Multiple Vitamin (MULTIVITAMIN PO) Take by mouth.    [provider]  OVER THE COUNTER MEDICATION Take 1 packet by mouth daily. Emergen-C  Vitamin C & zinc Patient not taking: Reported on 04/02/2022    [provider]  scopolamine (TRANSDERM SCOP, 1.5 MG,) 1 MG/3DAYS Place 1 patch (1.5 mg total) onto the skin every 3 (three) days. 04/02/22   Dorothyann Peng, MD  UNABLE TO FIND Med Name: Hair, skin, nails supplement    [provider]  VENTOLIN HFA 108 (90 Base)  MCG/ACT inhaler Inhale 1 puff into the lungs every 4 (four) hours as needed for shortness of breath or wheezing. 11/26/19   [provider]  VITAMIN D PO Take by mouth.    [provider]    Family History Family History  Problem Relation Age of Onset   Healthy Mother    Healthy Father    CAD Sister    Stroke Maternal Grandmother    Bone cancer Maternal Grandmother    Cancer Maternal Uncle     Social History Social History   Tobacco Use   Smoking status: Never    Passive exposure: Never   Smokeless tobacco: Never  Vaping Use   Vaping Use: Never used  Substance Use Topics   Alcohol use: Not Currently    Comment: very rare   Drug use: Never     Allergies   Patient has no known allergies.   Review of Systems Review of Systems  Gastrointestinal:  Positive for constipation.     Physical Exam Triage Vital Signs ED Triage Vitals [07/29/22 1303]  Enc Vitals Group     BP 133/78     Pulse Rate 78     Resp 18     Temp 97.8 F (36.6 C)     Temp Source Oral     SpO2 98 %     Weight      Height      Head Circumference      Peak Flow      Pain Score      Pain Loc      Pain Edu?      Excl. in GC?    No data found.  Updated Vital Signs BP 133/78 (BP Location: Left Arm)   Pulse 78   Temp 97.8 F (36.6 C) (Oral)   Resp 18   LMP  (LMP Unknown)   SpO2 98%   Visual Acuity Right Eye Distance:   Left Eye Distance:   Bilateral Distance:    Right Eye Near:   Left Eye Near:    Bilateral Near:     Physical Exam Vitals reviewed.  Constitutional:      General: She is not in acute distress.    Appearance: She is not ill-appearing, toxic-appearing or diaphoretic.  HENT:     Mouth/Throat:     Mouth: Mucous membranes are moist.     Pharynx: No oropharyngeal exudate or posterior oropharyngeal erythema.  Eyes:     Extraocular Movements: Extraocular movements intact.     Conjunctiva/sclera: Conjunctivae normal.  Pupils: Pupils are equal,  round, and reactive to light.  Cardiovascular:     Rate and Rhythm: Normal rate and regular rhythm.     Heart sounds: No murmur heard. Pulmonary:     Effort: Pulmonary effort is normal.     Breath sounds: No stridor. No wheezing, rhonchi or rales.  Abdominal:     Palpations: Abdomen is soft. There is no mass.     Tenderness: There is abdominal tenderness (BLQ, right >left).  Musculoskeletal:     Cervical back: Neck supple. No rigidity.  Lymphadenopathy:     Cervical: No cervical adenopathy.  Skin:    Coloration: Skin is not jaundiced or pale.  Neurological:     General: No focal deficit present.     Mental Status: She is oriented to person, place, and time.  Psychiatric:        Behavior: Behavior normal.      UC Treatments / Results  Labs (all labs ordered are listed, but only abnormal results are displayed) Labs Reviewed  POCT URINALYSIS DIPSTICK, ED / UC  POC URINE PREG, ED  CERVICOVAGINAL ANCILLARY ONLY    EKG   Radiology No results found.  Procedures Procedures (including critical care time)  Medications Ordered in UC Medications  ketorolac (TORADOL) 30 MG/ML injection 30 mg (has no administration in time range)    Initial Impression / Assessment and Plan / UC Course  I have reviewed the triage vital signs and the nursing notes.  Pertinent labs & imaging results that were available during my care of the patient were reviewed by me and considered in my medical decision making (see chart for details).        UPT is negative.  Urinalysis is normal and completely negative.  Pain relief is provided.  Swab was done and we will notify her and treat per protocol any positives constipation.  She is already taking fiber pills and drinking fluids for the constipation, and that has improved.  She is having normal stools 1 or 2 times a day.  Discussed with her that the fibroid could be causing some pain, and that she should also see her primary care to consider GI  evaluation if this is continuing.  If she worsens with vomiting or more intense pain, she is going to proceed to the emergency room for further evaluation Final Clinical Impressions(s) / UC Diagnoses   Final diagnoses:  Pelvic pain in female     Discharge Instructions      The pregnancy test was negative  The urinalysis was normal and all negative  Staff will notify you if there is anything positive on your swab  You have been given a shot of Toradol 30 mg today.  Take ibuprofen 800 mg--1 tab every 8 hours as needed for pain.  Please contact your primary care about this pain, so they can help you decide what other evaluation you should have       ED Prescriptions     Medication Sig Dispense Auth. Provider   ibuprofen (ADVIL) 800 MG tablet Take 1 tablet (800 mg total) by mouth every 8 (eight) hours as needed (pain). 21 tablet Malek Skog, Janace Aris, MD   ondansetron (ZOFRAN-ODT) 4 MG disintegrating tablet Take 1 tablet (4 mg total) by mouth every 8 (eight) hours as needed for nausea or vomiting. 10 tablet Marlinda Mike Janace Aris, MD      PDMP not reviewed this encounter.   Zenia Resides, MD 07/29/22 1345

## 2022-07-29 NOTE — Discharge Instructions (Addendum)
The pregnancy test was negative  The urinalysis was normal and all negative  Staff will notify you if there is anything positive on your swab  You have been given a shot of Toradol 30 mg today.  Take ibuprofen 800 mg--1 tab every 8 hours as needed for pain.  Please contact your primary care about this pain, so they can help you decide what other evaluation you should have

## 2022-07-29 NOTE — ED Triage Notes (Signed)
Here with several complaints. Pt reports having unprotected sexual intercourse over the last couple weeks. Pt reports ongoing constipation. Pt reports seeing a blood clot last week after using the restroom. Pt reports period is a week late.

## 2022-07-30 LAB — CERVICOVAGINAL ANCILLARY ONLY
Bacterial Vaginitis (gardnerella): NEGATIVE
Candida Glabrata: NEGATIVE
Candida Vaginitis: NEGATIVE
Chlamydia: NEGATIVE
Comment: NEGATIVE
Comment: NEGATIVE
Comment: NEGATIVE
Comment: NEGATIVE
Comment: NEGATIVE
Comment: NORMAL
Neisseria Gonorrhea: NEGATIVE
Trichomonas: NEGATIVE

## 2022-08-08 ENCOUNTER — Encounter: Payer: Self-pay | Admitting: Internal Medicine

## 2022-08-08 ENCOUNTER — Ambulatory Visit (INDEPENDENT_AMBULATORY_CARE_PROVIDER_SITE_OTHER): Payer: 59 | Admitting: Internal Medicine

## 2022-08-08 VITALS — BP 120/84 | HR 79 | Temp 98.3°F | Ht 63.8 in | Wt 216.0 lb

## 2022-08-08 DIAGNOSIS — F331 Major depressive disorder, recurrent, moderate: Secondary | ICD-10-CM

## 2022-08-08 DIAGNOSIS — D500 Iron deficiency anemia secondary to blood loss (chronic): Secondary | ICD-10-CM

## 2022-08-08 DIAGNOSIS — R7309 Other abnormal glucose: Secondary | ICD-10-CM | POA: Diagnosis not present

## 2022-08-08 DIAGNOSIS — K5901 Slow transit constipation: Secondary | ICD-10-CM | POA: Diagnosis not present

## 2022-08-08 DIAGNOSIS — Z6837 Body mass index (BMI) 37.0-37.9, adult: Secondary | ICD-10-CM

## 2022-08-08 DIAGNOSIS — Z2821 Immunization not carried out because of patient refusal: Secondary | ICD-10-CM

## 2022-08-08 DIAGNOSIS — E6609 Other obesity due to excess calories: Secondary | ICD-10-CM

## 2022-08-08 LAB — HEMOGLOBIN A1C
Est. average glucose Bld gHb Est-mCnc: 114 mg/dL
Hgb A1c MFr Bld: 5.6 % (ref 4.8–5.6)

## 2022-08-08 MED ORDER — TRAZODONE HCL 50 MG PO TABS: 50.0000 mg | ORAL_TABLET | Freq: Every day | ORAL | 1 refills | Status: DC

## 2022-08-08 NOTE — Progress Notes (Signed)
Jeri Cos [place],acting as a Neurosurgeon for Gwynneth Aliment, MD.,have documented all relevant documentation on the behalf of Gwynneth Aliment, MD,as directed by  Gwynneth Aliment, MD while in the presence of Gwynneth Aliment, MD.    Subjective:     Patient ID: Tammie Williams , female    DOB: Feb 02, 1971 , 51 y.o.   MRN: [id number]   Chief Complaint  Patient presents with   abnormal glucose    HPI  She presents today for f/u prediabetes. She admits she has not been exercising regularly recently. She denies headaches, chest pain and shortness of breath.      Past Medical History:  Diagnosis Date   Anemia    Anemia    Anxiety    Asthma    GERD (gastroesophageal reflux disease) 01/01/2018   Near syncope 01/01/2018   Palpitations 01/01/2018   Sickle cell trait ([facility])    Vitamin D deficiency      Family History  Problem Relation Age of Onset   Healthy Mother    Healthy Father    CAD Sister    Stroke Maternal Grandmother    Bone cancer Maternal Grandmother    Cancer Maternal Uncle      Current Outpatient Medications:    Ascorbic Acid (VITAMIN C PO), Take by mouth., Disp: , Rfl:    CALCIUM PO, Take by mouth., Disp: , Rfl:    ibuprofen (ADVIL) 800 MG tablet, Take 1 tablet (800 mg total) by mouth every 8 (eight) hours as needed (pain)., Disp: 21 tablet, Rfl: 0   Iron-FA-B Cmp-C-Biot-Probiotic (FUSION PLUS) CAPS, Take 1 capsule by mouth daily., Disp: 30 capsule, Rfl: 3   MAGNESIUM PO, Take by mouth., Disp: , Rfl:    Multiple Vitamin (MULTIVITAMIN PO), Take by mouth., Disp: , Rfl:    ondansetron (ZOFRAN-ODT) 4 MG disintegrating tablet, Take 1 tablet (4 mg total) by mouth every 8 (eight) hours as needed for nausea or vomiting., Disp: 10 tablet, Rfl: 0   traZODone (DESYREL) 50 MG tablet, Take 1 tablet (50 mg total) by mouth at bedtime., Disp: 30 tablet, Rfl: 1   UNABLE TO FIND, Med Name: Hair, skin, nails supplement, Disp: , Rfl:    VENTOLIN HFA 108 (90 Base) MCG/ACT inhaler, Inhale 1  puff into the lungs every 4 (four) hours as needed for shortness of breath or wheezing., Disp: , Rfl:    VITAMIN D PO, Take by mouth., Disp: , Rfl:    OVER THE COUNTER MEDICATION, Take 1 packet by mouth daily. Emergen-C  Vitamin C & zinc (Patient not taking: Reported on 04/02/2022), Disp: , Rfl:    scopolamine (TRANSDERM SCOP, 1.5 MG,) 1 MG/3DAYS, Place 1 patch (1.5 mg total) onto the skin every 3 (three) days. (Patient not taking: Reported on 08/08/2022), Disp: 4 patch, Rfl: 0   No Known Allergies   Review of Systems  Constitutional: Negative.   Respiratory: Negative.    Cardiovascular: Negative.   Gastrointestinal:  Positive for constipation.  Neurological: Negative.   Psychiatric/Behavioral:  Positive for sleep disturbance.      Today's Vitals   08/08/22 0942  BP: 120/84  Pulse: 79  Temp: 98.3 F (36.8 C)  Weight: 216 lb (98 kg)  Height: 5' 3.8" (1.621 m)  PainSc: 0-No pain   Body mass index is 37.31 kg/m.  Wt Readings from Last 3 Encounters:  08/08/22 216 lb (98 kg)  04/02/22 210 lb 12.8 oz (95.6 kg)  10/31/21 217 lb 12.8 oz (98.8 kg)  Objective:  Physical Exam Vitals and nursing note reviewed.  Constitutional:      Appearance: Normal appearance. She is obese.  HENT:     Head: Normocephalic and atraumatic.     Nose:     Comments: Masked     Mouth/Throat:     Comments: Masked  Eyes:     Extraocular Movements: Extraocular movements intact.  Cardiovascular:     Rate and Rhythm: Normal rate and regular rhythm.     Heart sounds: Normal heart sounds.  Pulmonary:     Effort: Pulmonary effort is normal.     Breath sounds: Normal breath sounds.  Abdominal:     General: Bowel sounds are normal.     Palpations: Abdomen is soft.  Musculoskeletal:     Cervical back: Normal range of motion.  Skin:    General: Skin is warm.  Neurological:     General: No focal deficit present.     Mental Status: She is alert.  Psychiatric:        Mood and Affect: Mood normal.         Behavior: Behavior normal.       Assessment And Plan:     1. Abnormal glucose Comments: She admits that she has not been compliant with her improved lifestyle changes due to recent stressors. I will recheck a1c today. - Hemoglobin A1c  2. Slow transit constipation Comments: She was given samples of Linzess, 145mg  daily. If no relief, will refer her to GI as requested. - TSH  3. Iron deficiency anemia due to chronic blood loss Comments: She is currently on Fusion Plus. I wlil check iron levels today. - CBC no Diff - Iron, TIBC and Ferritin Panel  4. Moderate episode of recurrent major depressive disorder ([facility]CC) Comments: She agrees to try trazodone 50mg  nightly, in hopes this will also address her sleep issues. She does plan on seeing Dr. in the near future. - TSH  5. Class 2 obesity due to excess calories without serious comorbidity with body mass index (BMI) of 37.0 to 37.9 in adult Comments: She is aware of 6lb weight gain in the past four months. She is agreeable to PREP program. - Amb Referral To Provider Referral Exercise Program (P.R.E.P)  6. Influenza vaccination declined   Patient was given opportunity to ask questions. Patient verbalized understanding of the plan and was able to repeat key elements of the plan. All questions were answered to their satisfaction.   I, , MD, have reviewed all documentation for this visit. The documentation on 08/08/22 for the exam, diagnosis, procedures, and orders are all accurate and complete.   IF YOU HAVE BEEN REFERRED TO A SPECIALIST, IT MAY TAKE 1-2 WEEKS TO SCHEDULE/PROCESS THE REFERRAL. IF YOU HAVE NOT HEARD FROM US/SPECIALIST IN TWO WEEKS, PLEASE GIVE Gwynneth Aliment A CALL AT [phone] X [phone].   THE PATIENT IS ENCOURAGED TO PRACTICE SOCIAL DISTANCING DUE TO THE COVID-19 PANDEMIC.

## 2022-08-08 NOTE — Patient Instructions (Signed)

## 2022-08-09 ENCOUNTER — Encounter: Payer: Self-pay | Admitting: Internal Medicine

## 2022-08-09 LAB — CBC
Hematocrit: 37.3 % (ref 34.0–46.6)
Hemoglobin: 12.4 g/dL (ref 11.1–15.9)
MCH: 27.6 pg (ref 26.6–33.0)
MCHC: 33.2 g/dL (ref 31.5–35.7)
MCV: 83 fL (ref 79–97)
Platelets: 374 10*3/uL (ref 150–450)
RBC: 4.5 x10E6/uL (ref 3.77–5.28)
RDW: 14 % (ref 11.7–15.4)
WBC: 8.2 10*3/uL (ref 3.4–10.8)

## 2022-08-09 LAB — IRON,TIBC AND FERRITIN PANEL
Ferritin: 23 ng/mL (ref 15–150)
Iron Saturation: 32 % (ref 15–55)
Iron: 112 ug/dL (ref 27–159)
Total Iron Binding Capacity: 351 ug/dL (ref 250–450)
UIBC: 239 ug/dL (ref 131–425)

## 2022-08-09 LAB — TSH: TSH: 0.396 u[IU]/mL — ABNORMAL LOW (ref 0.450–4.500)

## 2022-08-10 ENCOUNTER — Telehealth: Payer: Self-pay

## 2022-08-10 NOTE — Telephone Encounter (Signed)
VMT pt requesting call back reference PREP referral.   

## 2022-08-13 ENCOUNTER — Other Ambulatory Visit: Payer: Self-pay

## 2022-08-14 ENCOUNTER — Telehealth: Payer: Self-pay

## 2022-08-14 LAB — T4, FREE: Free T4: 1.49 ng/dL (ref 0.82–1.77)

## 2022-08-14 LAB — SPECIMEN STATUS REPORT

## 2022-08-14 NOTE — Telephone Encounter (Signed)
Call from pt returning call ref PREP Explained program  to pt as well as locations.  Works from home however doesn't finish until 6p . Do have a night time class from 6p-715p on T/TH. Next start will be January. Will call her closer to start of class to confirm start date and do intake

## 2022-08-21 ENCOUNTER — Telehealth: Payer: Self-pay

## 2022-08-21 NOTE — Telephone Encounter (Signed)
I called and left pt vm letting her know her form has been completed and faxed. A copy has been placed in the pt folder in my desk. YL,RMA

## 2022-08-22 ENCOUNTER — Other Ambulatory Visit: Payer: Self-pay

## 2022-08-22 DIAGNOSIS — R7989 Other specified abnormal findings of blood chemistry: Secondary | ICD-10-CM

## 2022-09-12 ENCOUNTER — Encounter: Payer: Self-pay | Admitting: Internal Medicine

## 2022-09-19 ENCOUNTER — Telehealth: Payer: 59 | Admitting: Internal Medicine

## 2022-10-10 ENCOUNTER — Other Ambulatory Visit: Payer: Self-pay | Admitting: Internal Medicine

## 2022-10-11 ENCOUNTER — Encounter: Payer: Self-pay | Admitting: Internal Medicine

## 2022-11-06 ENCOUNTER — Ambulatory Visit (INDEPENDENT_AMBULATORY_CARE_PROVIDER_SITE_OTHER): Payer: 59 | Admitting: Internal Medicine

## 2022-11-06 ENCOUNTER — Encounter: Payer: Self-pay | Admitting: Internal Medicine

## 2022-11-06 VITALS — BP 118/82 | Temp 98.0°F | Ht 63.0 in | Wt 217.0 lb

## 2022-11-06 DIAGNOSIS — F331 Major depressive disorder, recurrent, moderate: Secondary | ICD-10-CM

## 2022-11-06 DIAGNOSIS — M542 Cervicalgia: Secondary | ICD-10-CM | POA: Diagnosis not present

## 2022-11-06 DIAGNOSIS — Z0000 Encounter for general adult medical examination without abnormal findings: Secondary | ICD-10-CM

## 2022-11-06 DIAGNOSIS — D573 Sickle-cell trait: Secondary | ICD-10-CM

## 2022-11-06 DIAGNOSIS — E6609 Other obesity due to excess calories: Secondary | ICD-10-CM

## 2022-11-06 DIAGNOSIS — Z6837 Body mass index (BMI) 37.0-37.9, adult: Secondary | ICD-10-CM

## 2022-11-06 DIAGNOSIS — R7309 Other abnormal glucose: Secondary | ICD-10-CM | POA: Diagnosis not present

## 2022-11-06 NOTE — Progress Notes (Signed)
I,Tammie Williams,acting as a scribe for Tammie Greenland, MD.,have documented all relevant documentation on the behalf of Tammie Greenland, MD,as directed by  Tammie Greenland, MD while in the presence of Tammie Greenland, MD.   Subjective:     Patient ID: Tammie Williams , female    DOB: Aug 22, 1971 , 52 y.o.   MRN: [id number]   Chief Complaint  Patient presents with   Annual Exam    HPI  She is here today for a full physical examination. She is followed by Dr. Delora Williams for her pap smears. She did have a pap done Jan 2024. She is followed by Dr. Darleene Williams for her mood disorder. She is now on fluoxetine and hydroxyzine. She has noticed that the hydroxyzine makes her itch.   Letter sent to gyn for pap results.      Past Medical History:  Diagnosis Date   Anemia    Anemia    Anxiety    Asthma    GERD (gastroesophageal reflux disease) 01/01/2018   Near syncope 01/01/2018   Palpitations 01/01/2018   Sickle cell trait ([place])    Vitamin D deficiency      Family History  Problem Relation Age of Onset   Healthy Mother    Healthy Father    CAD Sister    Stroke Maternal Grandmother    Bone cancer Maternal Grandmother    Cancer Maternal Uncle      Current Outpatient Medications:    Ascorbic Acid (VITAMIN C PO), Take by mouth., Disp: , Rfl:    CALCIUM PO, Take by mouth., Disp: , Rfl:    FLUoxetine (PROZAC) 10 MG capsule, Take 10 mg by mouth daily., Disp: , Rfl:    hydrOXYzine (ATARAX) 50 MG tablet, Take 25-50 mg by mouth at bedtime., Disp: , Rfl:    ibuprofen (ADVIL) 800 MG tablet, Take 1 tablet (800 mg total) by mouth every 8 (eight) hours as needed (pain)., Disp: 21 tablet, Rfl: 0   Iron-FA-B Cmp-C-Biot-Probiotic (FUSION PLUS) CAPS, Take 1 capsule by mouth daily., Disp: 30 capsule, Rfl: 3   MAGNESIUM PO, Take by mouth., Disp: , Rfl:    Multiple Vitamin (MULTIVITAMIN PO), Take by mouth., Disp: , Rfl:    ondansetron (ZOFRAN-ODT) 4 MG disintegrating tablet, Take 1 tablet (4 mg total) by  mouth every 8 (eight) hours as needed for nausea or vomiting., Disp: 10 tablet, Rfl: 0   traZODone (DESYREL) 50 MG tablet, TAKE 1 TABLET(50 MG) BY MOUTH AT BEDTIME, Disp: 90 tablet, Rfl: 1   VENTOLIN HFA 108 (90 Base) MCG/ACT inhaler, Inhale 1 puff into the lungs every 4 (four) hours as needed for shortness of breath or wheezing., Disp: , Rfl:    VITAMIN D PO, Take by mouth., Disp: , Rfl:    UNABLE TO FIND, Med Name: Hair, skin, nails supplement (Patient not taking: Reported on 11/06/2022), Disp: , Rfl:    No Known Allergies    The patient states she uses none for birth control. Last LMP was Patient's last menstrual period was 09/23/2022 (exact date).. Negative for Dysmenorrhea. Negative for: breast discharge, breast lump(s), breast pain and breast self exam. Associated symptoms include abnormal vaginal bleeding. Pertinent negatives include abnormal bleeding (hematology), anxiety, decreased libido, depression, difficulty falling sleep, dyspareunia, history of infertility, nocturia, sexual dysfunction, sleep disturbances, urinary incontinence, urinary urgency, vaginal discharge and vaginal itching. Diet regular.The patient states her exercise level is    . The patient's tobacco use is:  Social History  Tobacco Use  Smoking Status Never   Passive exposure: Never  Smokeless Tobacco Never  . She has been exposed to passive smoke. The patient's alcohol use is:  Social History   Substance and Sexual Activity  Alcohol Use Not Currently   Comment: very rare   Review of Systems  Constitutional: Negative.   HENT: Negative.    Eyes: Negative.   Respiratory: Negative.    Cardiovascular: Negative.   Gastrointestinal: Negative.   Endocrine: Negative.   Genitourinary: Negative.   Musculoskeletal:  Positive for neck pain.  Skin: Negative.   Allergic/Immunologic: Negative.   Neurological: Negative.   Hematological: Negative.   Psychiatric/Behavioral: Negative.       Today's Vitals   11/06/22  1117  BP: 118/82  Temp: 98 F (36.7 C)  Weight: 217 lb (98.4 kg)  Height: 5' 3"$  (1.6 m)   Body mass index is 38.44 kg/m.  Wt Readings from Last 3 Encounters:  11/06/22 217 lb (98.4 kg)  08/08/22 216 lb (98 kg)  04/02/22 210 lb 12.8 oz (95.6 kg)    Objective:  Physical Exam Vitals and nursing note reviewed.  Constitutional:      Appearance: Normal appearance.  HENT:     Head: Normocephalic and atraumatic.     Right Ear: Tympanic membrane, ear canal and external ear normal.     Left Ear: Tympanic membrane, ear canal and external ear normal.     Nose:     Comments: Masked     Mouth/Throat:     Comments: Masked  Eyes:     Extraocular Movements: Extraocular movements intact.     Conjunctiva/sclera: Conjunctivae normal.     Pupils: Pupils are equal, round, and reactive to light.  Cardiovascular:     Rate and Rhythm: Normal rate and regular rhythm.     Pulses: Normal pulses.     Heart sounds: Normal heart sounds.  Pulmonary:     Effort: Pulmonary effort is normal.     Breath sounds: Normal breath sounds.  Abdominal:     General: Bowel sounds are normal.     Palpations: Abdomen is soft.     Comments: Obese, soft. Difficult to assess organomegaly due to body habitus.   Genitourinary:    Comments: deferred Musculoskeletal:        General: Normal range of motion.     Cervical back: Normal range of motion and neck supple. Tenderness present.  Skin:    General: Skin is warm and dry.  Neurological:     General: No focal deficit present.     Mental Status: She is alert and oriented to person, place, and time.  Psychiatric:        Mood and Affect: Mood normal.        Behavior: Behavior normal.         Assessment And Plan:     1. Encounter for general adult medical examination w/o abnormal findings Comments: A full exam was performed. Importance of monthly self breast exams was discussed with the patient.  PATIENT IS ADVISED TO GET 30-45 MINUTES REGULAR EXERCISE NO LESS  THAN FOUR TO FIVE DAYS PER WEEK - BOTH WEIGHTBEARING EXERCISES AND AEROBIC ARE RECOMMENDED.  PATIENT IS ADVISED TO FOLLOW A HEALTHY DIET WITH AT LEAST SIX FRUITS/VEGGIES PER DAY, DECREASE INTAKE OF RED MEAT, AND TO INCREASE FISH INTAKE TO TWO DAYS PER WEEK.  MEATS/FISH SHOULD NOT BE FRIED, BAKED OR BROILED IS PREFERABLE.  IT IS ALSO IMPORTANT TO CUT BACK ON YOUR SUGAR  INTAKE. PLEASE AVOID ANYTHING WITH ADDED SUGAR, CORN SYRUP OR OTHER SWEETENERS. IF YOU MUST USE A SWEETENER, YOU CAN TRY STEVIA. IT IS ALSO IMPORTANT TO AVOID ARTIFICIALLY SWEETENERS AND DIET BEVERAGES. LASTLY, I SUGGEST WEARING SPF 50 SUNSCREEN ON EXPOSED PARTS AND ESPECIALLY WHEN IN THE DIRECT SUNLIGHT FOR AN EXTENDED PERIOD OF TIME.  PLEASE AVOID FAST FOOD RESTAURANTS AND INCREASE YOUR WATER INTAKE. - CMP14+EGFR - CBC - Hemoglobin A1c - Lipid panel - TSH - Vitamin D (25 hydroxy)  2. Moderate episode of recurrent major depressive disorder (HCC) Comments: Chronic, as per Dr. Darleene Williams. She will c/w fluoxetine. Encouraged to let Dr. Loni Muse know about side effects of hydroxyzine. She is also encouraged to take vitamin D regularly to help with mood.   3. Neck pain Comments: I will refer her to Chiro, encouraged to apply topical pain cream to affected areas. Will benefit from regular stretching, importance of proper posture d/w patient.  We discussed stretching exercises she can do throughout the day, sx likely exacerbated by sitting at computer daily.  - Ambulatory referral to Chiropractic  4. Abnormal glucose Comments: Her a1c has been elevated in the past. I will recheck this today. Encouraged to limit her intake of sugary foods/drinks.  5. Sickle cell trait (HCC) Comments: Chronic, stable. She is encouraged to stay well hydrated.  6. Class 2 obesity due to excess calories without serious comorbidity with body mass index (BMI) of 37.0 to 37.9 in adult She is encouraged to strive for BMI less than 30 to decrease cardiac risk. Advised to  aim for at least 150 minutes of exercise per week.   Patient was given opportunity to ask questions. Patient verbalized understanding of the plan and was able to repeat key elements of the plan. All questions were answered to their satisfaction.   I, Tammie Greenland, MD, have reviewed all documentation for this visit. The documentation on 11/06/22 for the exam, diagnosis, procedures, and orders are all accurate and complete.   THE PATIENT IS ENCOURAGED TO PRACTICE SOCIAL DISTANCING DUE TO THE COVID-19 PANDEMIC.

## 2022-11-06 NOTE — Patient Instructions (Signed)

## 2022-11-07 LAB — CBC
Hematocrit: 36.1 % (ref 34.0–46.6)
Hemoglobin: 11.4 g/dL (ref 11.1–15.9)
MCH: 25.8 pg — ABNORMAL LOW (ref 26.6–33.0)
MCHC: 31.6 g/dL (ref 31.5–35.7)
MCV: 82 fL (ref 79–97)
Platelets: 361 10*3/uL (ref 150–450)
RBC: 4.42 x10E6/uL (ref 3.77–5.28)
RDW: 15.1 % (ref 11.7–15.4)
WBC: 8 10*3/uL (ref 3.4–10.8)

## 2022-11-07 LAB — LIPID PANEL
Chol/HDL Ratio: 3.6 ratio (ref 0.0–4.4)
Cholesterol, Total: 213 mg/dL — ABNORMAL HIGH (ref 100–199)
HDL: 60 mg/dL (ref >39)
LDL Chol Calc (NIH): 135 mg/dL — ABNORMAL HIGH (ref 0–99)
Triglycerides: 101 mg/dL (ref 0–149)
VLDL Cholesterol Cal: 18 mg/dL (ref 5–40)

## 2022-11-07 LAB — VITAMIN D 25 HYDROXY (VIT D DEFICIENCY, FRACTURES): Vit D, 25-Hydroxy: 21.5 ng/mL — ABNORMAL LOW (ref 30.0–100.0)

## 2022-11-07 LAB — CMP14+EGFR
ALT: 13 IU/L (ref 0–32)
AST: 16 IU/L (ref 0–40)
Albumin/Globulin Ratio: 1.4 (ref 1.2–2.2)
Albumin: 3.9 g/dL (ref 3.9–4.9)
Alkaline Phosphatase: 135 IU/L — ABNORMAL HIGH (ref 44–121)
BUN/Creatinine Ratio: 14 (ref 9–23)
BUN: 10 mg/dL (ref 6–24)
Bilirubin Total: 0.3 mg/dL (ref 0.0–1.2)
CO2: 20 mmol/L (ref 20–29)
Calcium: 9.5 mg/dL (ref 8.7–10.2)
Chloride: 104 mmol/L (ref 96–106)
Creatinine, Ser: 0.69 mg/dL (ref 0.57–1.00)
Globulin, Total: 2.8 g/dL (ref 1.5–4.5)
Glucose: 83 mg/dL (ref 70–99)
Potassium: 4.5 mmol/L (ref 3.5–5.2)
Sodium: 137 mmol/L (ref 134–144)
Total Protein: 6.7 g/dL (ref 6.0–8.5)
eGFR: 106 mL/min/{1.73_m2} (ref >59)

## 2022-11-07 LAB — TSH: TSH: 0.876 u[IU]/mL (ref 0.450–4.500)

## 2022-11-07 LAB — HEMOGLOBIN A1C
Est. average glucose Bld gHb Est-mCnc: 120 mg/dL
Hgb A1c MFr Bld: 5.8 % — ABNORMAL HIGH (ref 4.8–5.6)

## 2022-11-09 ENCOUNTER — Encounter: Payer: Self-pay | Admitting: Internal Medicine

## 2022-11-09 ENCOUNTER — Other Ambulatory Visit: Payer: Self-pay | Admitting: Internal Medicine

## 2022-11-09 DIAGNOSIS — Z1231 Encounter for screening mammogram for malignant neoplasm of breast: Secondary | ICD-10-CM

## 2022-11-10 DIAGNOSIS — F331 Major depressive disorder, recurrent, moderate: Secondary | ICD-10-CM | POA: Insufficient documentation

## 2022-11-10 DIAGNOSIS — Z6837 Body mass index (BMI) 37.0-37.9, adult: Secondary | ICD-10-CM | POA: Insufficient documentation

## 2022-11-10 DIAGNOSIS — M542 Cervicalgia: Secondary | ICD-10-CM | POA: Insufficient documentation

## 2022-11-20 ENCOUNTER — Encounter (HOSPITAL_COMMUNITY): Payer: Self-pay

## 2022-11-20 ENCOUNTER — Ambulatory Visit (HOSPITAL_COMMUNITY)
Admission: EM | Admit: 2022-11-20 | Discharge: 2022-11-20 | Disposition: A | Discharge status: Home / Self Care | Payer: 59 | Attending: Internal Medicine | Admitting: Internal Medicine

## 2022-11-20 DIAGNOSIS — M62838 Other muscle spasm: Secondary | ICD-10-CM

## 2022-11-20 MED ORDER — KETOROLAC TROMETHAMINE 30 MG/ML IJ SOLN
30.0000 mg | Freq: Once | INTRAMUSCULAR | Status: AC
  Administered 2022-11-20: 30 mg via INTRAMUSCULAR

## 2022-11-20 MED ORDER — METAXALONE 800 MG PO TABS: 800.0000 mg | ORAL_TABLET | Freq: Two times a day (BID) | ORAL | 0 refills | Status: DC

## 2022-11-20 MED ORDER — METHYLPREDNISOLONE 4 MG PO TBPK: ORAL_TABLET | ORAL | 0 refills | Status: DC

## 2022-11-20 MED ORDER — KETOROLAC TROMETHAMINE 30 MG/ML IJ SOLN
INTRAMUSCULAR | Status: AC
  Filled 2022-11-20: qty 1
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
## 2022-11-20 NOTE — ED Triage Notes (Signed)
Pt is here for neck and right shoulder pain x 5days

## 2022-11-20 NOTE — Discharge Instructions (Addendum)
Star the prednisone tomorrow am Keep your appointment with the chiropractor as scheduled for next week

## 2022-11-21 ENCOUNTER — Encounter: Payer: Self-pay | Admitting: Internal Medicine

## 2022-11-23 ENCOUNTER — Other Ambulatory Visit: Payer: Self-pay | Admitting: Internal Medicine

## 2022-12-20 ENCOUNTER — Other Ambulatory Visit: Payer: 59

## 2022-12-20 DIAGNOSIS — R7989 Other specified abnormal findings of blood chemistry: Secondary | ICD-10-CM

## 2022-12-21 LAB — TSH+FREE T4
Free T4: 1.39 ng/dL (ref 0.82–1.77)
TSH: 0.566 u[IU]/mL (ref 0.450–4.500)

## 2022-12-26 ENCOUNTER — Ambulatory Visit
Admission: RE | Admit: 2022-12-26 | Discharge: 2022-12-26 | Disposition: A | Discharge status: Home / Self Care | Payer: 59 | Source: Ambulatory Visit | Attending: Internal Medicine | Admitting: Internal Medicine

## 2022-12-26 DIAGNOSIS — Z1231 Encounter for screening mammogram for malignant neoplasm of breast: Secondary | ICD-10-CM

## 2023-03-25 ENCOUNTER — Encounter: Payer: Self-pay | Admitting: Internal Medicine

## 2023-03-25 ENCOUNTER — Other Ambulatory Visit: Payer: Self-pay | Admitting: Internal Medicine

## 2023-07-17 ENCOUNTER — Other Ambulatory Visit: Payer: Self-pay | Admitting: Internal Medicine

## 2023-07-17 MED ORDER — SCOPOLAMINE 1 MG/3DAYS TD PT72: 1.0000 | MEDICATED_PATCH | TRANSDERMAL | 1 refills | Status: DC

## 2023-08-08 ENCOUNTER — Encounter: Payer: Self-pay | Admitting: Internal Medicine

## 2023-08-14 ENCOUNTER — Ambulatory Visit (INDEPENDENT_AMBULATORY_CARE_PROVIDER_SITE_OTHER): Payer: 59 | Admitting: Family Medicine

## 2023-08-14 ENCOUNTER — Encounter: Payer: Self-pay | Admitting: Family Medicine

## 2023-08-14 VITALS — BP 120/70 | HR 76 | Temp 98.1°F | Ht 63.0 in | Wt 203.0 lb

## 2023-08-14 DIAGNOSIS — J4521 Mild intermittent asthma with (acute) exacerbation: Secondary | ICD-10-CM | POA: Diagnosis not present

## 2023-08-14 DIAGNOSIS — R079 Chest pain, unspecified: Secondary | ICD-10-CM

## 2023-08-14 DIAGNOSIS — E6609 Other obesity due to excess calories: Secondary | ICD-10-CM | POA: Diagnosis not present

## 2023-08-14 DIAGNOSIS — E66812 Obesity, class 2: Secondary | ICD-10-CM

## 2023-08-14 DIAGNOSIS — Z6835 Body mass index (BMI) 35.0-35.9, adult: Secondary | ICD-10-CM

## 2023-08-14 NOTE — Progress Notes (Addendum)
I,Jameka J Llittleton, CMA,acting as a Neurosurgeon for [employer], NP.,have documented all relevant documentation on the behalf of Ellender Hose, NP,as directed by  Ellender Hose, NP while in the presence of Ellender Hose, NP.  Subjective:  Patient ID: Tammie Williams , female    DOB: December 16, 1970 , 52 y.o.   MRN: [id number]  Chief Complaint  Patient presents with   ER f/u    HPI  Patient is a 52 year old  female who presents for ER follow up.Patient was seen last week at [facility] for chest pain/discomfort and asthma exacerbation. Patient was put on a round of Prednisone but she states that she stopped taking it half way because she was still having chest pains. Patient was referred to a cardiologist in [place] but d/t distance she would prefer to have one in [place].she was referred to a cardiologist in [place] but d/t she would prefer one in [place].     Past Medical History:  Diagnosis Date   Anemia    Anemia    Anxiety    Asthma    GERD (gastroesophageal reflux disease) 01/01/2018   Near syncope 01/01/2018   Palpitations 01/01/2018   Sickle cell trait (HCC)    Vitamin D deficiency      Family History  Problem Relation Age of Onset   Healthy Mother    Healthy Father    CAD Sister    Stroke Maternal Grandmother    Bone cancer Maternal Grandmother    Cancer Maternal Uncle      Current Outpatient Medications:    Albuterol-Budesonide (AIRSUPRA) 90-80 MCG/ACT AERO, Inhale 2 puffs into the lungs every 4 (four) hours as needed., Disp: 10.7 g, Rfl: 3   Ascorbic Acid (VITAMIN C PO), Take by mouth., Disp: , Rfl:    MAGNESIUM PO, Take by mouth., Disp: , Rfl:    montelukast (SINGULAIR) 10 MG tablet, Take 1 tablet (10 mg total) by mouth daily., Disp: 30 tablet, Rfl: 2   Multiple Vitamin (MULTIVITAMIN PO), Take by mouth., Disp: , Rfl:    UNABLE TO FIND, Med Name: Hair, skin, nails supplement, Disp: , Rfl:    VITAMIN D PO, Take by mouth., Disp: , Rfl:    CALCIUM PO, Take by mouth.  (Patient not taking: Reported on 08/14/2023), Disp: , Rfl:    FLUoxetine (PROZAC) 10 MG capsule, Take 10 mg by mouth daily. (Patient not taking: Reported on 08/14/2023), Disp: , Rfl:    hydrOXYzine (ATARAX) 50 MG tablet, Take 25-50 mg by mouth at bedtime. (Patient not taking: Reported on 08/14/2023), Disp: , Rfl:    ibuprofen (ADVIL) 800 MG tablet, Take 1 tablet (800 mg total) by mouth every 8 (eight) hours as needed (pain). (Patient not taking: Reported on 08/14/2023), Disp: 21 tablet, Rfl: 0   Iron-FA-B Cmp-C-Biot-Probiotic (FUSION PLUS) CAPS, TAKE 1 CAPSULE BY MOUTH DAILY (Patient not taking: Reported on 08/14/2023), Disp: 30 capsule, Rfl: 3   metaxalone (SKELAXIN) 800 MG tablet, Take 1 tablet (800 mg total) by mouth 2 (two) times daily. (Patient not taking: Reported on 08/14/2023), Disp: 20 tablet, Rfl: 0   methylPREDNISolone (MEDROL DOSEPAK) 4 MG TBPK tablet, Take as directed (Patient not taking: Reported on 08/14/2023), Disp: 21 tablet, Rfl: 0   ondansetron (ZOFRAN-ODT) 4 MG disintegrating tablet, Take 1 tablet (4 mg total) by mouth every 8 (eight) hours as needed for nausea or vomiting. (Patient not taking: Reported on 08/14/2023), Disp: 10 tablet, Rfl: 0   traZODone (DESYREL) 50 MG tablet, TAKE 1  TABLET(50 MG) BY MOUTH AT BEDTIME (Patient not taking: Reported on 08/14/2023), Disp: 90 tablet, Rfl: 1   VENTOLIN HFA 108 (90 Base) MCG/ACT inhaler, Inhale 1 puff into the lungs every 4 (four) hours as needed for shortness of breath or wheezing. (Patient not taking: Reported on 08/14/2023), Disp: , Rfl:    No Known Allergies   Review of Systems  Constitutional: Negative.   Respiratory:  Positive for cough and shortness of breath.   Cardiovascular:  Positive for chest pain.  Musculoskeletal: Negative.   Psychiatric/Behavioral: Negative.       Today's Vitals   08/14/23 1147  BP: 120/70  Pulse: 76  Temp: 98.1 F (36.7 C)  Weight: 203 lb (92.1 kg)  Height: 5\' 3"  (1.6 m)  PainSc: 5    PainLoc: Chest   Body mass index is 35.96 kg/m.  Wt Readings from Last 3 Encounters:  08/14/23 203 lb (92.1 kg)  11/06/22 217 lb (98.4 kg)  08/08/22 216 lb (98 kg)    The 10-year ASCVD risk score (Arnett DK, et al., 2019) is: 1.4%   Values used to calculate the score:     Age: 4 years     Sex: Female     Is Non-Hispanic African American: Yes     Diabetic: No     Tobacco smoker: No     Systolic Blood Pressure: 120 mmHg     Is BP treated: No     HDL Cholesterol: 60 mg/dL     Total Cholesterol: 213 mg/dL  Objective:  Physical Exam Constitutional:      Appearance: Normal appearance.  HENT:     Head: Normocephalic.     Nose: No congestion or rhinorrhea.  Cardiovascular:     Rate and Rhythm: Normal rate and regular rhythm.     Pulses: Normal pulses.     Heart sounds: Normal heart sounds.  Pulmonary:     Effort: Pulmonary effort is normal.     Breath sounds: Normal breath sounds.  Abdominal:     General: Bowel sounds are normal.  Skin:    General: Skin is warm and dry.  Neurological:     Mental Status: She is alert.         Assessment And Plan:  Mild intermittent asthma with exacerbation Assessment & Plan: Breathing tx given in the clinic and changed inhaler to [place].  Orders: -     IPPB; Future  Chest pain, unspecified type Assessment & Plan: Referred to Cardiology  Orders: -     Ambulatory referral to Cardiology  Class 2 obesity due to excess calories with body mass index (BMI) of 35.0 to 35.9 in adult, unspecified whether serious comorbidity present Assessment & Plan: She is encouraged to strive for BMI less than 30 to decrease cardiac risk. Advised to aim for at least 150 minutes of exercise per week.    Other orders -     Airsupra; Inhale 2 puffs into the lungs every 4 (four) hours as needed.  Dispense: 10.7 g; Refill: 3 -     Montelukast Sodium; Take 1 tablet (10 mg total) by mouth daily.  Dispense: 30 tablet; Refill: 2    No follow-ups on  file.  Patient was given opportunity to ask questions. Patient verbalized understanding of the plan and was able to repeat key elements of the plan. All questions were answered to their satisfaction.    I, Ellender Hose, NP, have reviewed all documentation for this visit. The documentation on 08/21/23 for the  exam, diagnosis, procedures, and orders are all accurate and complete.   IF YOU HAVE BEEN REFERRED TO A SPECIALIST, IT MAY TAKE 1-2 WEEKS TO SCHEDULE/PROCESS THE REFERRAL. IF YOU HAVE NOT HEARD FROM US/SPECIALIST IN TWO WEEKS, PLEASE GIVE [place] A CALL AT [phone].

## 2023-08-20 NOTE — Progress Notes (Incomplete)
I,Jameka J Llittleton, CMA,acting as a Neurosurgeon for [employer], NP.,have documented all relevant documentation on the behalf of Ellender Hose, NP,as directed by  Ellender Hose, NP while in the presence of Ellender Hose, NP.  Subjective:  Patient ID: Tammie Williams , female    DOB: September 08, 1971 , 52 y.o.   MRN: [id number]  Chief Complaint  Patient presents with  . ER f/u    HPI  Patient is a 89 female who presents for ER follow up.Patient was seen last week at [facility] for chest pain/discomfort and asthma exacerbation. Patient was put on a round of Prednisone but she states that she stopped taking it half way because she was still having chest pains. Patient was referred to a cardiologist in [place] but d/t distance she would prefer to have one in [place].she was referred to a cardiologist in [place] but d/t she would prefer one in [place].     Past Medical History:  Diagnosis Date  . Anemia   . Anemia   . Anxiety   . Asthma   . GERD (gastroesophageal reflux disease) 01/01/2018  . Near syncope 01/01/2018  . Palpitations 01/01/2018  . Sickle cell trait (HCC)   . Vitamin D deficiency      Family History  Problem Relation Age of Onset  . Healthy Mother   . Healthy Father   . CAD Sister   . Stroke Maternal Grandmother   . Bone cancer Maternal Grandmother   . Cancer Maternal Uncle      Current Outpatient Medications:  .  scopolamine (TRANSDERM-SCOP) 1 MG/3DAYS, Place 1 patch (1.5 mg total) onto the skin every 3 (three) days., Disp: 4 patch, Rfl: 1 .  Ascorbic Acid (VITAMIN C PO), Take by mouth., Disp: , Rfl:  .  CALCIUM PO, Take by mouth., Disp: , Rfl:  .  FLUoxetine (PROZAC) 10 MG capsule, Take 10 mg by mouth daily., Disp: , Rfl:  .  hydrOXYzine (ATARAX) 50 MG tablet, Take 25-50 mg by mouth at bedtime., Disp: , Rfl:  .  ibuprofen (ADVIL) 800 MG tablet, Take 1 tablet (800 mg total) by mouth every 8 (eight) hours as needed (pain)., Disp: 21 tablet, Rfl: 0 .  Iron-FA-B  Cmp-C-Biot-Probiotic (FUSION PLUS) CAPS, TAKE 1 CAPSULE BY MOUTH DAILY, Disp: 30 capsule, Rfl: 3 .  MAGNESIUM PO, Take by mouth., Disp: , Rfl:  .  metaxalone (SKELAXIN) 800 MG tablet, Take 1 tablet (800 mg total) by mouth 2 (two) times daily., Disp: 20 tablet, Rfl: 0 .  methylPREDNISolone (MEDROL DOSEPAK) 4 MG TBPK tablet, Take as directed, Disp: 21 tablet, Rfl: 0 .  Multiple Vitamin (MULTIVITAMIN PO), Take by mouth., Disp: , Rfl:  .  ondansetron (ZOFRAN-ODT) 4 MG disintegrating tablet, Take 1 tablet (4 mg total) by mouth every 8 (eight) hours as needed for nausea or vomiting., Disp: 10 tablet, Rfl: 0 .  traZODone (DESYREL) 50 MG tablet, TAKE 1 TABLET(50 MG) BY MOUTH AT BEDTIME, Disp: 90 tablet, Rfl: 1 .  UNABLE TO FIND, Med Name: Hair, skin, nails supplement (Patient not taking: Reported on 11/06/2022), Disp: , Rfl:  .  VENTOLIN HFA 108 (90 Base) MCG/ACT inhaler, Inhale 1 puff into the lungs every 4 (four) hours as needed for shortness of breath or wheezing., Disp: , Rfl:  .  VITAMIN D PO, Take by mouth., Disp: , Rfl:    No Known Allergies   Review of Systems  Constitutional: Negative.   Respiratory:  Positive for cough and shortness of breath.  Cardiovascular:  Positive for chest pain.     There were no vitals filed for this visit. There is no height or weight on file to calculate BMI.  Wt Readings from Last 3 Encounters:  11/06/22 217 lb (98.4 kg)  08/08/22 216 lb (98 kg)  04/02/22 210 lb 12.8 oz (95.6 kg)    The 10-year ASCVD risk score (Arnett DK, et al., 2019) is: 2.2%   Values used to calculate the score:     Age: 51 years     Sex: Female     Is Non-Hispanic African American: Yes     Diabetic: No     Tobacco smoker: No     Systolic Blood Pressure: 134 mmHg     Is BP treated: No     HDL Cholesterol: 60 mg/dL     Total Cholesterol: 213 mg/dL  Objective:  Physical Exam Constitutional:      Appearance: Normal appearance.  Cardiovascular:     Rate and Rhythm: Normal rate  and regular rhythm.     Pulses: Normal pulses.     Heart sounds: Normal heart sounds.  Pulmonary:     Effort: Pulmonary effort is normal.     Breath sounds: Normal breath sounds.  Abdominal:     General: Bowel sounds are normal.  Skin:    General: Skin is warm and dry.  Neurological:     Mental Status: She is alert.        Assessment And Plan:  There are no diagnoses linked to this encounter.  No follow-ups on file.  Patient was given opportunity to ask questions. Patient verbalized understanding of the plan and was able to repeat key elements of the plan. All questions were answered to their satisfaction.    I, Ellender Hose, NP, have reviewed all documentation for this visit. The documentation on 08/14/23 for the exam, diagnosis, procedures, and orders are all accurate and complete.   IF YOU HAVE BEEN REFERRED TO A SPECIALIST, IT MAY TAKE 1-2 WEEKS TO SCHEDULE/PROCESS THE REFERRAL. IF YOU HAVE NOT HEARD FROM US/SPECIALIST IN TWO WEEKS, PLEASE GIVE [place] A CALL AT [phone].

## 2023-08-21 DIAGNOSIS — J4521 Mild intermittent asthma with (acute) exacerbation: Secondary | ICD-10-CM | POA: Insufficient documentation

## 2023-08-21 DIAGNOSIS — R0789 Other chest pain: Secondary | ICD-10-CM | POA: Insufficient documentation

## 2023-08-21 DIAGNOSIS — E6609 Other obesity due to excess calories: Secondary | ICD-10-CM | POA: Insufficient documentation

## 2023-08-21 DIAGNOSIS — E66812 Obesity, class 2: Secondary | ICD-10-CM | POA: Insufficient documentation

## 2023-08-21 DIAGNOSIS — R079 Chest pain, unspecified: Secondary | ICD-10-CM | POA: Insufficient documentation

## 2023-08-21 MED ORDER — AIRSUPRA 90-80 MCG/ACT IN AERO: 2.0000 | INHALATION_SPRAY | RESPIRATORY_TRACT | 3 refills | Status: AC | PRN

## 2023-08-21 MED ORDER — MONTELUKAST SODIUM 10 MG PO TABS: 10.0000 mg | ORAL_TABLET | Freq: Every day | ORAL | 2 refills | Status: DC

## 2023-08-21 NOTE — Assessment & Plan Note (Signed)
Breathing tx given in the clinic and changed inhaler to [place].

## 2023-08-21 NOTE — Assessment & Plan Note (Signed)
Referred to Cardiology

## 2023-08-21 NOTE — Assessment & Plan Note (Signed)
She is encouraged to strive for BMI less than 30 to decrease cardiac risk. Advised to aim for at least 150 minutes of exercise per week.

## 2023-09-09 ENCOUNTER — Encounter: Payer: Self-pay | Admitting: Internal Medicine

## 2023-09-10 ENCOUNTER — Other Ambulatory Visit: Payer: Self-pay | Admitting: Internal Medicine

## 2023-09-10 MED ORDER — CITALOPRAM HYDROBROMIDE 10 MG PO TABS: 10.0000 mg | ORAL_TABLET | Freq: Every day | ORAL | 1 refills | Status: DC

## 2023-10-08 ENCOUNTER — Encounter: Payer: Self-pay | Admitting: Internal Medicine

## 2023-10-17 ENCOUNTER — Ambulatory Visit (HOSPITAL_COMMUNITY): Payer: 59

## 2023-10-17 ENCOUNTER — Encounter (HOSPITAL_COMMUNITY): Payer: Self-pay

## 2023-10-17 ENCOUNTER — Ambulatory Visit (HOSPITAL_COMMUNITY)
Admission: EM | Admit: 2023-10-17 | Discharge: 2023-10-17 | Disposition: A | Discharge status: Home / Self Care | Payer: 59 | Attending: Internal Medicine | Admitting: Internal Medicine

## 2023-10-17 DIAGNOSIS — S60211A Contusion of right wrist, initial encounter: Secondary | ICD-10-CM

## 2023-10-17 NOTE — ED Triage Notes (Signed)
Patient reports that she was putting a cast iron pot under the cabinet and lost her grip. Patient states the handle hit her right wrist. Patient c/o right wrist swelling, but states is less today. Incident occurred 2 days ago.  Patient has been using an ace wrap and ice. Patient took Naproxen the first day.

## 2023-10-17 NOTE — ED Provider Notes (Signed)
[facility]    CSN: [id number] Arrival date & time: 10/17/23  1940      History   Chief Complaint No chief complaint on file.   HPI Tammie Williams is a 53 y.o. female who presents with R wrist pain since hitting it accidentally with a cast iron pan handle when she was tying to put it away 2 days ago.  She had a large swollen area but has gone  down. Has bandaged it with coban, and been taking Naproxen and icing the area. She feels tingling on the area of pain.    Past Medical History:  Diagnosis Date   Anemia    Anemia    Anxiety    Asthma    GERD (gastroesophageal reflux disease) 01/01/2018   Near syncope 01/01/2018   Palpitations 01/01/2018   Sickle cell trait (HCC)    Vitamin D deficiency     Patient Active Problem List   Diagnosis Date Noted   Chest pain 08/21/2023   Mild intermittent asthma with exacerbation 08/21/2023   Class 2 obesity due to excess calories with body mass index (BMI) of 35.0 to 35.9 in adult 08/21/2023   Neck pain 11/10/2022   Moderate episode of recurrent major depressive disorder (HCC) 11/10/2022   Class 2 obesity due to excess calories without serious comorbidity with body mass index (BMI) of 37.0 to 37.9 in adult 11/10/2022   Abnormal glucose 04/02/2022   Increased frequency of urination 08/31/2021   Menorrhagia 08/31/2021   Vaginitis 08/31/2021   Constipation 08/31/2021   Diastasis recti 08/31/2021   Umbilical hernia without obstruction and without gangrene 08/31/2021   Current moderate episode of major depressive disorder, unspecified whether recurrent (HCC) 10/24/2020   Palpitations 01/01/2018   Near syncope 01/01/2018   Body mass index (BMI) of 37.0 to 37.9 in adult 05/01/2017   Iron deficiency anemia 08/17/2014   Dizziness 01/31/2014   URI (upper respiratory infection) 10/09/2013   Adjustment disorder with disturbance of emotion 09/17/2013   Patellar tendinitis 06/18/2011   Swelling of knee joint, left 06/18/2011   Other  malaise and fatigue 01/25/2011   Premenstrual tension syndrome 01/25/2011   Sickle cell trait (HCC) 10/20/2010   Vitamin D deficiency 08/18/2010   Allergic rhinitis 05/23/2010   Reactive airway disease 05/23/2010    Past Surgical History:  Procedure Laterality Date   CESAREAN SECTION     WISDOM TOOTH EXTRACTION      OB History     Gravida  4   Para  1   Term  1   Preterm      AB  3   Living  1      SAB  1   IAB  1   Ectopic  1   Multiple      Live Births               Home Medications    Prior to Admission medications   Medication Sig Start Date End Date Taking? Authorizing Provider  citalopram (CELEXA) 10 MG tablet Take 1 tablet (10 mg total) by mouth daily. 09/10/23 09/09/24  Dorothyann Peng, MD  Albuterol-Budesonide [facility]) 90-80 MCG/ACT AERO Inhale 2 puffs into the lungs every 4 (four) hours as needed. 08/20/23   Ellender Hose, NP  Ascorbic Acid (VITAMIN C PO) Take by mouth.    [provider]  MAGNESIUM PO Take by mouth.    [provider]  Multiple Vitamin (MULTIVITAMIN PO) Take by mouth.    [provider]  UNABLE TO FIND Med Name: Hair, skin, nails supplement    [provider]  VITAMIN D PO Take by mouth.    [provider]    Family History Family History  Problem Relation Age of Onset   Healthy Mother    Healthy Father    CAD Sister    Stroke Maternal Grandmother    Bone cancer Maternal Grandmother    Cancer Maternal Uncle     Social History Social History   Tobacco Use   Smoking status: Never    Passive exposure: Never   Smokeless tobacco: Never  Vaping Use   Vaping status: Never Used  Substance Use Topics   Alcohol use: Not Currently    Comment: very rare   Drug use: Never     Allergies   Patient has no known allergies.   Review of Systems Review of Systems As noted in HPI  Physical Exam Triage Vital Signs ED Triage Vitals  Encounter Vitals Group     BP 10/17/23  1949 127/79     Systolic BP Percentile --      Diastolic BP Percentile --      Pulse Rate 10/17/23 1949 (!) 2     Resp 10/17/23 1949 16     Temp 10/17/23 1949 98.2 F (36.8 C)     Temp Source 10/17/23 1949 Oral     SpO2 10/17/23 1949 95 %     Weight --      Height --      Head Circumference --      Peak Flow --      Pain Score 10/17/23 1951 7     Pain Loc --      Pain Education --      Exclude from Growth Chart --    No data found.  Updated Vital Signs BP 127/79 (BP Location: Left Arm)   Pulse (!) 2   Temp 98.2 F (36.8 C) (Oral)   Resp 16   LMP 10/10/2023 (Approximate)   SpO2 95%   Visual Acuity Right Eye Distance:   Left Eye Distance:   Bilateral Distance:    Right Eye Near:   Left Eye Near:    Bilateral Near:     Physical Exam Vitals and nursing note reviewed.  Constitutional:      General: She is not in acute distress.    Appearance: She is normal weight. She is not toxic-appearing.  HENT:     Right Ear: External ear normal.     Left Ear: External ear normal.  Eyes:     General: No scleral icterus.    Conjunctiva/sclera: Conjunctivae normal.  Pulmonary:     Effort: Pulmonary effort is normal.  Musculoskeletal:     Cervical back: Neck supple.     Comments: R HAND- with mild swelling on dorsal proximal metacarpal region around 2nd metacarpal region. Ginger grip is 4/5. ROM is normal with pain.   Skin:    General: Skin is warm.     Capillary Refill: Capillary refill takes less than 2 seconds.     Findings: Bruising present. No erythema, lesion or rash.  Neurological:     Mental Status: She is alert and oriented to person, place, and time.     Sensory: No sensory deficit.     Gait: Gait normal.  Psychiatric:        Mood and Affect: Mood normal.        Behavior: Behavior normal.  Thought Content: Thought content normal.        Judgment: Judgment normal.      UC Treatments / Results  Labs (all labs ordered are listed, but only abnormal  results are displayed) Labs Reviewed - No data to display  EKG   Radiology No results found. Preliminary read of R hand xray is negative. Final read is pending.  Procedures Procedures (including critical care time)  Medications Ordered in UC Medications - No data to display  Initial Impression / Assessment and Plan / UC Course  I have reviewed the triage vital signs and the nursing notes.  Pertinent  imaging results that were available during my care of the patient were reviewed by me and considered in my medical decision making (see chart for details).  R hand contusion  Ace bandage was applied. She may continue ice on area of pain and swelling,but needs to apply cloth over the skin.    Final Clinical Impressions(s) / UC Diagnoses   Final diagnoses:  Contusion of right wrist, initial encounter     Discharge Instructions      Continue the same treatments you have been doing, but do ice only for 48 hours Wear the ace bandage for 3 days, then only as needed      ED Prescriptions   None    PDMP not reviewed this encounter.   Garey Ham, Cordelia Poche 10/17/23 2052

## 2023-10-17 NOTE — Discharge Instructions (Signed)
Continue the same treatments you have been doing, but do ice only for 48 hours Wear the ace bandage for 3 days, then only as needed

## 2023-10-30 ENCOUNTER — Ambulatory Visit: Payer: 59 | Admitting: Cardiology

## 2023-10-30 NOTE — Progress Notes (Addendum)
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 Discuss with patient their interest in participating in the free health coaching program and refer to REF 2201/Care Navigation

## 2023-11-13 ENCOUNTER — Other Ambulatory Visit: Payer: Self-pay | Admitting: Internal Medicine

## 2023-11-13 DIAGNOSIS — Z1231 Encounter for screening mammogram for malignant neoplasm of breast: Secondary | ICD-10-CM

## 2023-11-19 ENCOUNTER — Ambulatory Visit: Payer: 59 | Attending: Internal Medicine

## 2023-11-19 ENCOUNTER — Ambulatory Visit (INDEPENDENT_AMBULATORY_CARE_PROVIDER_SITE_OTHER): Payer: 59 | Admitting: Internal Medicine

## 2023-11-19 ENCOUNTER — Encounter: Payer: Self-pay | Admitting: Internal Medicine

## 2023-11-19 VITALS — BP 110/70 | HR 89 | Temp 98.1°F | Ht 63.0 in | Wt 200.0 lb

## 2023-11-19 DIAGNOSIS — R7309 Other abnormal glucose: Secondary | ICD-10-CM

## 2023-11-19 DIAGNOSIS — E6609 Other obesity due to excess calories: Secondary | ICD-10-CM

## 2023-11-19 DIAGNOSIS — R002 Palpitations: Secondary | ICD-10-CM | POA: Diagnosis not present

## 2023-11-19 DIAGNOSIS — Z0000 Encounter for general adult medical examination without abnormal findings: Secondary | ICD-10-CM | POA: Diagnosis not present

## 2023-11-19 DIAGNOSIS — F419 Anxiety disorder, unspecified: Secondary | ICD-10-CM | POA: Diagnosis not present

## 2023-11-19 DIAGNOSIS — R0789 Other chest pain: Secondary | ICD-10-CM

## 2023-11-19 DIAGNOSIS — Z6835 Body mass index (BMI) 35.0-35.9, adult: Secondary | ICD-10-CM

## 2023-11-19 DIAGNOSIS — E66812 Obesity, class 2: Secondary | ICD-10-CM

## 2023-11-19 NOTE — Progress Notes (Unsigned)
I,Jameka J Llittleton, CMA,acting as a Neurosurgeon for Gwynneth Aliment, MD.,have documented all relevant documentation on the behalf of Gwynneth Aliment, MD,as directed by  Gwynneth Aliment, MD while in the presence of Gwynneth Aliment, MD.  Subjective:    Patient ID: Tammie Williams , female    DOB: 02-25-1971 , 53 y.o.   MRN: [id number]  Chief Complaint  Patient presents with  . Annual Exam    HPI  She is here today for a full physical examination. She is followed by Dr. Connye Burkitt for her GYN exams. She was last seen Dec/Jan 2025 and had a pap smear done.     Past Medical History:  Diagnosis Date  . Anemia   . Anemia   . Anxiety   . Asthma   . GERD (gastroesophageal reflux disease) 01/01/2018  . Near syncope 01/01/2018  . Palpitations 01/01/2018  . Sickle cell trait (HCC)   . Vitamin D deficiency      Family History  Problem Relation Age of Onset  . Healthy Mother   . Healthy Father   . CAD Sister   . Stroke Maternal Grandmother   . Bone cancer Maternal Grandmother   . Cancer Maternal Uncle      Current Outpatient Medications:  .  Albuterol-Budesonide (AIRSUPRA) 90-80 MCG/ACT AERO, Inhale 2 puffs into the lungs every 4 (four) hours as needed., Disp: 10.7 g, Rfl: 3 .  Ascorbic Acid (VITAMIN C PO), Take by mouth., Disp: , Rfl:  .  citalopram (CELEXA) 10 MG tablet, Take 1 tablet (10 mg total) by mouth daily., Disp: 30 tablet, Rfl: 1 .  MAGNESIUM PO, Take by mouth., Disp: , Rfl:  .  Misc Natural Products (ELDERBERRY IMMUNE COMPLEX PO), , Disp: , Rfl:  .  Multiple Vitamin (MULTIVITAMIN PO), Take by mouth., Disp: , Rfl:  .  UNABLE TO FIND, Med Name: Hair, skin, nails supplement, Disp: , Rfl:  .  VITAMIN D PO, Take by mouth., Disp: , Rfl:    No Known Allergies    The patient states she uses none for birth control. No LMP recorded. (Menstrual status: Irregular Periods).. Negative for Dysmenorrhea. Negative for: breast discharge, breast lump(s), breast pain and breast self exam. Associated  symptoms include abnormal vaginal bleeding. Pertinent negatives include abnormal bleeding (hematology), anxiety, decreased libido, depression, difficulty falling sleep, dyspareunia, history of infertility, nocturia, sexual dysfunction, sleep disturbances, urinary incontinence, urinary urgency, vaginal discharge and vaginal itching. Diet regular.The patient states her exercise level is  intermittent.  . The patient's tobacco use is:  Social History   Tobacco Use  Smoking Status Never  . Passive exposure: Never  Smokeless Tobacco Never  . She has been exposed to passive smoke. The patient's alcohol use is:  Social History   Substance and Sexual Activity  Alcohol Use Not Currently   Comment: very rare    Review of Systems  Constitutional: Negative.   HENT: Negative.    Eyes: Negative.   Respiratory: Negative.    Cardiovascular: Negative.   Gastrointestinal: Negative.   Endocrine: Negative.   Genitourinary: Negative.   Musculoskeletal: Negative.   Skin: Negative.   Allergic/Immunologic: Negative.   Neurological: Negative.   Hematological: Negative.   Psychiatric/Behavioral: Negative.       Today's Vitals   11/19/23 1129  BP: 110/70  Pulse: 89  Temp: 98.1 F (36.7 C)  TempSrc: Oral  Weight: 200 lb (90.7 kg)  Height: 5\' 3"  (1.6 m)  PainSc: 0-No pain   Body  mass index is 35.43 kg/m.  Wt Readings from Last 3 Encounters:  11/19/23 200 lb (90.7 kg)  08/14/23 203 lb (92.1 kg)  11/06/22 217 lb (98.4 kg)     Objective:  Physical Exam Vitals and nursing note reviewed.  Constitutional:      Appearance: Normal appearance.  HENT:     Head: Normocephalic and atraumatic.     Right Ear: Tympanic membrane, ear canal and external ear normal.     Left Ear: Tympanic membrane, ear canal and external ear normal.  Eyes:     Extraocular Movements: Extraocular movements intact.     Conjunctiva/sclera: Conjunctivae normal.     Pupils: Pupils are equal, round, and reactive to light.   Cardiovascular:     Rate and Rhythm: Normal rate and regular rhythm.     Pulses: Normal pulses.     Heart sounds: Normal heart sounds.  Pulmonary:     Effort: Pulmonary effort is normal.     Breath sounds: Normal breath sounds.  Abdominal:     General: Bowel sounds are normal.     Palpations: Abdomen is soft.     Comments: Obese, soft. Difficult to assess organomegaly due to body habitus.   Genitourinary:    Comments: deferred Musculoskeletal:        General: Normal range of motion.     Cervical back: Normal range of motion and neck supple. Tenderness present.  Skin:    General: Skin is warm and dry.  Neurological:     General: No focal deficit present.     Mental Status: She is alert and oriented to person, place, and time.  Psychiatric:        Mood and Affect: Mood normal.        Behavior: Behavior normal.        Assessment And Plan:     Encounter for general adult medical examination w/o abnormal findings -     CMP14+EGFR -     CBC -     Lipid panel  Palpitations -     LONG TERM MONITOR (3-14 DAYS); Future -     Magnesium -     TSH -     EKG 12-Lead  Atypical chest pain -     Lipoprotein A (LPA) -     EKG 12-Lead  Abnormal glucose -     Hemoglobin A1c  Anxiety     Return for 1 year physical. Patient was given opportunity to ask questions. Patient verbalized understanding of the plan and was able to repeat key elements of the plan. All questions were answered to their satisfaction.     I, Gwynneth Aliment, MD, have reviewed all documentation for this visit. The documentation on 11/19/23 for the exam, diagnosis, procedures, and orders are all accurate and complete.

## 2023-11-19 NOTE — Patient Instructions (Signed)

## 2023-11-19 NOTE — Progress Notes (Unsigned)
 EP to read.

## 2023-11-20 ENCOUNTER — Encounter: Payer: Self-pay | Admitting: Internal Medicine

## 2023-11-20 NOTE — Assessment & Plan Note (Signed)

## 2023-11-20 NOTE — Assessment & Plan Note (Signed)
Chronic, she would like referral to a new provider.  She will continue with citalopram 10mg  daily for now.

## 2023-11-20 NOTE — Assessment & Plan Note (Signed)
Intermittent.  She has upcoming Cardiology appointment.

## 2023-11-20 NOTE — Assessment & Plan Note (Signed)
EKG performed, NSR w/ incomplete right bundle branch block, inferolateral ST-elevation, possible repolarization variant.  She agrees to [employer]. She is advised to wear for 14 days. I will  also check labs as below.

## 2023-11-20 NOTE — Assessment & Plan Note (Signed)
 Previous labs reviewed, her A1c has been elevated in the past. I will check an A1c today. Reminded to avoid refined sugars including sugary drinks/foods and processed meats including bacon, sausages and deli meats.

## 2023-11-21 LAB — CBC
Hematocrit: 34.1 % (ref 34.0–46.6)
Hemoglobin: 10.9 g/dL — ABNORMAL LOW (ref 11.1–15.9)
MCH: 25.5 pg — ABNORMAL LOW (ref 26.6–33.0)
MCHC: 32 g/dL (ref 31.5–35.7)
MCV: 80 fL (ref 79–97)
Platelets: 506 10*3/uL — ABNORMAL HIGH (ref 150–450)
RBC: 4.27 x10E6/uL (ref 3.77–5.28)
RDW: 15.2 % (ref 11.7–15.4)
WBC: 7.4 10*3/uL (ref 3.4–10.8)

## 2023-11-21 LAB — TSH: TSH: 0.533 u[IU]/mL (ref 0.450–4.500)

## 2023-11-21 LAB — LIPOPROTEIN A (LPA): Lipoprotein (a): 29.2 nmol/L (ref <75.0)

## 2023-11-21 LAB — CMP14+EGFR
ALT: 10 [IU]/L (ref 0–32)
AST: 18 [IU]/L (ref 0–40)
Albumin: 4.3 g/dL (ref 3.9–4.9)
Alkaline Phosphatase: 154 [IU]/L — ABNORMAL HIGH (ref 44–121)
BUN/Creatinine Ratio: 15 (ref 9–23)
BUN: 11 mg/dL (ref 6–24)
Bilirubin Total: 0.4 mg/dL (ref 0.0–1.2)
CO2: 22 mmol/L (ref 20–29)
Calcium: 10.1 mg/dL (ref 8.7–10.2)
Chloride: 102 mmol/L (ref 96–106)
Creatinine, Ser: 0.75 mg/dL (ref 0.57–1.00)
Globulin, Total: 2.9 g/dL (ref 1.5–4.5)
Glucose: 80 mg/dL (ref 70–99)
Potassium: 4.7 mmol/L (ref 3.5–5.2)
Sodium: 136 mmol/L (ref 134–144)
Total Protein: 7.2 g/dL (ref 6.0–8.5)
eGFR: 97 mL/min/{1.73_m2} (ref >59)

## 2023-11-21 LAB — LIPID PANEL
Chol/HDL Ratio: 3.5 {ratio} (ref 0.0–4.4)
Cholesterol, Total: 243 mg/dL — ABNORMAL HIGH (ref 100–199)
HDL: 69 mg/dL (ref >39)
LDL Chol Calc (NIH): 161 mg/dL — ABNORMAL HIGH (ref 0–99)
Triglycerides: 78 mg/dL (ref 0–149)
VLDL Cholesterol Cal: 13 mg/dL (ref 5–40)

## 2023-11-21 LAB — MAGNESIUM: Magnesium: 2.2 mg/dL (ref 1.6–2.3)

## 2023-11-21 LAB — HEMOGLOBIN A1C
Est. average glucose Bld gHb Est-mCnc: 114 mg/dL
Hgb A1c MFr Bld: 5.6 % (ref 4.8–5.6)

## 2023-11-22 ENCOUNTER — Telehealth: Payer: Self-pay

## 2023-11-22 NOTE — Telephone Encounter (Signed)
PER DR L OFFICE THEY HAVE BEEN CONTACTING PT W NO RESPONSE FROM PT. I LMOVM ALONG W THE NUMBER FOR HER TO CONTACT DR LONG OFFICE TO SCHEDULE AN APPT.

## 2023-11-23 DIAGNOSIS — R002 Palpitations: Secondary | ICD-10-CM

## 2023-11-28 ENCOUNTER — Ambulatory Visit: Payer: 59 | Attending: Cardiology | Admitting: Cardiology

## 2023-11-28 ENCOUNTER — Encounter: Payer: Self-pay | Admitting: Cardiology

## 2023-11-28 VITALS — BP 122/70 | HR 74 | Ht 64.0 in | Wt 200.0 lb

## 2023-11-28 DIAGNOSIS — Z01812 Encounter for preprocedural laboratory examination: Secondary | ICD-10-CM

## 2023-11-28 DIAGNOSIS — R072 Precordial pain: Secondary | ICD-10-CM

## 2023-11-28 DIAGNOSIS — R002 Palpitations: Secondary | ICD-10-CM

## 2023-11-28 MED ORDER — METOPROLOL TARTRATE 100 MG PO TABS: ORAL_TABLET | ORAL | 0 refills | Status: DC

## 2023-11-28 NOTE — Patient Instructions (Addendum)
 Medication Instructions:  Your physician recommends that you continue on your current medications as directed. Please refer to the Current Medication list given to you today.  *If you need a refill on your cardiac medications before your next appointment, please call your pharmacy*   Lab Work: CMET, Mag If you have labs (blood work) drawn today and your tests are completely normal, you will receive your results only by: MyChart Message (if you have MyChart) OR A paper copy in the mail If you have any lab test that is abnormal or we need to change your treatment, we will call you to review the results.   Testing/Procedures:   Your cardiac CT will be scheduled at one of the below locations:   [facility] [address] [place], [place] [address] [phone]  If scheduled at [facility], please arrive at the [facility] and Children's Entrance (Entrance C2) of [facility] 30 minutes prior to test start time. You can use the FREE valet parking offered at entrance C (encouraged to control the heart rate for the test)  Proceed to the [facility] Radiology Department (first floor) to check-in and test prep.  All radiology patients and guests should use entrance C2 at [facility], accessed from [facility], even though the hospital's physical address listed is [address]    Please follow these instructions carefully (unless otherwise directed):  An IV will be required for this test and Nitroglycerin will be given.   On the Night Before the Test: Be sure to Drink plenty of water. Do not consume any caffeinated/decaffeinated beverages or chocolate 12 hours prior to your test. Do not take any antihistamines 12 hours prior to your test.  On the Day of the Test: Drink plenty of water until 1 hour prior to the test. Do not eat any food 1 hour prior to test. You may take your regular medications prior to the test.  Take  metoprolol (Lopressor) two hours prior to test. If you take Furosemide/Hydrochlorothiazide/Spironolactone/Chlorthalidone, please HOLD on the morning of the test. Patients who wear a continuous glucose monitor MUST remove the device prior to scanning. FEMALES- please wear underwire-free bra if available, avoid dresses & tight clothing        After the Test: Drink plenty of water. After receiving IV contrast, you may experience a mild flushed feeling. This is normal. On occasion, you may experience a mild rash up to 24 hours after the test. This is not dangerous. If this occurs, you can take Benadryl 25 mg, Zyrtec, Claritin, or Allegra and increase your fluid intake. (Patients taking Tikosyn should avoid Benadryl, and may take Zyrtec, Claritin, or Allegra) If you experience trouble breathing, this can be serious. If it is severe call 911 IMMEDIATELY. If it is mild, please call our office.  We will call to schedule your test 2-4 weeks out understanding that some insurance companies will need an authorization prior to the service being performed.   For more information and frequently asked questions, please visit our website : [email or web address]  For non-scheduling related questions, please contact the cardiac imaging nurse navigator should you have any questions/concerns: Cardiac Imaging Nurse Navigators Direct Office Dial: [phone]   For scheduling needs, including cancellations and rescheduling, please call [place], [phone].    Follow-Up: At [facility], you and your health needs are our priority.  As part of our continuing mission to provide you with exceptional heart care, we have created designated Provider  Care Teams.  These Care Teams include your primary Cardiologist (physician) and Advanced Practice Providers (APPs -  Physician Assistants and Nurse Practitioners) who all work together to provide you with the care you need, when you need it.   Your  next appointment:   6 month(s)  Provider:   Thomasene Ripple, DO

## 2023-11-29 ENCOUNTER — Other Ambulatory Visit: Payer: Self-pay | Admitting: Internal Medicine

## 2023-11-29 ENCOUNTER — Other Ambulatory Visit: Payer: Self-pay | Admitting: Cardiology

## 2023-11-29 DIAGNOSIS — D649 Anemia, unspecified: Secondary | ICD-10-CM

## 2023-11-29 LAB — COMPREHENSIVE METABOLIC PANEL
ALT: 10 [IU]/L (ref 0–32)
AST: 22 [IU]/L (ref 0–40)
Albumin: 4.1 g/dL (ref 3.9–4.9)
Alkaline Phosphatase: 155 [IU]/L — ABNORMAL HIGH (ref 44–121)
BUN/Creatinine Ratio: 14 (ref 9–23)
BUN: 10 mg/dL (ref 6–24)
Bilirubin Total: 0.2 mg/dL (ref 0.0–1.2)
CO2: 21 mmol/L (ref 20–29)
Calcium: 9.8 mg/dL (ref 8.7–10.2)
Chloride: 106 mmol/L (ref 96–106)
Creatinine, Ser: 0.69 mg/dL (ref 0.57–1.00)
Globulin, Total: 2.8 g/dL (ref 1.5–4.5)
Glucose: 92 mg/dL (ref 70–99)
Potassium: 4.7 mmol/L (ref 3.5–5.2)
Sodium: 142 mmol/L (ref 134–144)
Total Protein: 6.9 g/dL (ref 6.0–8.5)
eGFR: 106 mL/min/{1.73_m2} (ref >59)

## 2023-11-29 LAB — MAGNESIUM: Magnesium: 2.2 mg/dL (ref 1.6–2.3)

## 2023-11-30 ENCOUNTER — Other Ambulatory Visit: Payer: Self-pay | Admitting: Cardiology

## 2023-11-30 NOTE — Progress Notes (Signed)
 Cardiology Office Note:    Date:  11/30/2023   ID:  Tammie Williams, DOB 10-24-1970, MRN [id number]  PCP:  Dorothyann Peng, MD  Cardiologist:  None  Electrophysiologist:  None   Referring MD: Ellender Hose, NP   " I am doing ok"  History of Present Illness:    Tammie Williams is a 53 y.o. female  with a history of high cholesterol, presents with chest pain and palpitations. The chest pain is described as a 'dull or heavy heaviness' that is exacerbated by stress and lack of rest. The patient has been experiencing these symptoms for a couple of months. The palpitations started a year ago and are also associated with lack of rest. The patient was previously evaluated for these symptoms and was diagnosed with asthma. The patient is currently wearing a heart monitor for further evaluation. The patient also reports a history of depression and anxiety.  Past Medical History:  Diagnosis Date   Anemia    Anemia    Anxiety    Asthma    GERD (gastroesophageal reflux disease) 01/01/2018   Near syncope 01/01/2018   Palpitations 01/01/2018   Sickle cell trait (HCC)    Vitamin D deficiency     Past Surgical History:  Procedure Laterality Date   CESAREAN SECTION     WISDOM TOOTH EXTRACTION      Current Medications: Current Meds  Medication Sig   Ascorbic Acid (VITAMIN C PO) Take by mouth.   ASHWAGANDHA PO Take 1 capsule by mouth as needed.   citalopram (CELEXA) 10 MG tablet Take 1 tablet (10 mg total) by mouth daily.   MAGNESIUM PO Take 1 tablet by mouth daily at 6 (six) AM.   Misc Natural Products (ELDERBERRY IMMUNE COMPLEX PO)    Multiple Vitamin (MULTIVITAMIN PO) Take by mouth.   VITAMIN D PO Take by mouth.   [DISCONTINUED] metoprolol tartrate (LOPRESSOR) 100 MG tablet Take 2 hours prior to CT     Allergies:   Patient has no known allergies.   Social History   Socioeconomic History   Marital status: Single    Spouse name: Not on file   Number of children: Not on file   Years of  education: Not on file   Highest education level: Not on file  Occupational History   Not on file  Tobacco Use   Smoking status: Never    Passive exposure: Never   Smokeless tobacco: Never  Vaping Use   Vaping status: Never Used  Substance and Sexual Activity   Alcohol use: Not Currently    Comment: very rare   Drug use: Never   Sexual activity: Not Currently  Other Topics Concern   Not on file  Social History Narrative   Not on file   Social Drivers of Health   Financial Resource Strain: Not on file  Food Insecurity: Not on file  Transportation Needs: Not on file  Physical Activity: Not on file  Stress: Not on file  Social Connections: Unknown (02/09/2022)   Received from [facility]   Social Network    Social Network: Not on file     Family History: The patient's family history includes Bone cancer in her maternal grandmother; CAD in her sister; Cancer in her maternal uncle; Healthy in her father and mother; Stroke in her maternal grandmother.  ROS:   Review of Systems  Constitution: Negative for decreased appetite, fever and weight gain.  HENT: Negative for congestion, ear discharge, hoarse voice  and sore throat.   Eyes: Negative for discharge, redness, vision loss in right eye and visual halos.  Cardiovascular: reports chest pain, dyspnea on exertion,. Negative for  leg swelling, orthopnea and palpitations.  Respiratory: Negative for cough, hemoptysis, shortness of breath and snoring.   Endocrine: Negative for heat intolerance and polyphagia.  Hematologic/Lymphatic: Negative for bleeding problem. Does not bruise/bleed easily.  Skin: Negative for flushing, nail changes, rash and suspicious lesions.  Musculoskeletal: Negative for arthritis, joint pain, muscle cramps, myalgias, neck pain and stiffness.  Gastrointestinal: Negative for abdominal pain, bowel incontinence, diarrhea and excessive appetite.  Genitourinary: Negative for decreased libido,  genital sores and incomplete emptying.  Neurological: Negative for brief paralysis, focal weakness, headaches and loss of balance.  Psychiatric/Behavioral: Negative for altered mental status, depression and suicidal ideas.  Allergic/Immunologic: Negative for HIV exposure and persistent infections.    EKGs/Labs/Other Studies Reviewed:    The following studies were reviewed today:   EKG:  The ekg ordered today demonstrates sinus rhythm HR 74 bpm  Recent Labs: 11/19/2023: Hemoglobin 10.9; Platelets 506; TSH 0.533 11/29/2023: ALT 10; BUN 10; Creatinine, Ser 0.69; Magnesium 2.2; Potassium 4.7; Sodium 142  Recent Lipid Panel    Component Value Date/Time   CHOL 243 (H) 11/19/2023 1228   TRIG 78 11/19/2023 1228   HDL 69 11/19/2023 1228   CHOLHDL 3.5 11/19/2023 1228   LDLCALC 161 (H) 11/19/2023 1228    Physical Exam:    VS:  BP 122/70 (BP Location: Left Arm, Patient Position: Sitting, Cuff Size: Large)   Pulse 74   Ht 5\' 4"  (1.626 m)   Wt 200 lb (90.7 kg)   BMI 34.33 kg/m     Wt Readings from Last 3 Encounters:  11/28/23 200 lb (90.7 kg)  11/19/23 200 lb (90.7 kg)  08/14/23 203 lb (92.1 kg)     GEN: Well nourished, well developed in no acute distress HEENT: Normal NECK: No JVD; No carotid bruits LYMPHATICS: No lymphadenopathy CARDIAC: S1S2 noted,RRR, no murmurs, rubs, gallops RESPIRATORY:  Clear to auscultation without rales, wheezing or rhonchi  ABDOMEN: Soft, non-tender, non-distended, +bowel sounds, no guarding. EXTREMITIES: No edema, No cyanosis, no clubbing MUSCULOSKELETAL:  No deformity  SKIN: Warm and dry NEUROLOGIC:  Alert and oriented x 3, non-focal PSYCHIATRIC:  Normal affect, good insight  ASSESSMENT:    1. Palpitations   2. Precordial pain   3. Pre-procedure lab exam    PLAN:    Chest Pain   Described as a heaviness, particularly when stressed or fatigued. No family history of heart disease. High cholesterol noted. EKG normal.   -Order Coronary CTA to  assess for blockages and inform future prevention strategies.   -Encourage patient to continue light exercise as tolerated.    Palpitations   Occurring intermittently for the past year, particularly when fatigued or stressed. Currently wearing a monitor for 9 more days.   -Review monitor results once completed.    Hyperlipidemia   Total cholesterol slightly elevated as of last check in February.   -Continue current management and monitor regularly.    Menopausal Symptoms   Reports sweating. Still menstruating.   -Monitor symptoms and manage as needed.    Depression and Anxiety   Patient has a history of these conditions and is currently managing them.   -Continue current management and monitor regularly.    Follow-up   Plan to review results of Coronary CTA and monitor. Depending on results, may consider stress test or other interventions. Plan to see patient  in 6 months if all results are normal.  The patient is in agreement with the above plan. The patient left the office in stable condition.  The patient will follow up in   Medication Adjustments/Labs and Tests Ordered: Current medicines are reviewed at length with the patient today.  Concerns regarding medicines are outlined above.  Orders Placed This Encounter  Procedures   CT CORONARY MORPH W/CTA COR W/SCORE W/CA W/CM &/OR WO/CM   Comprehensive Metabolic Panel (CMET)   Magnesium   EKG 12-Lead   Meds ordered this encounter  Medications   DISCONTD: metoprolol tartrate (LOPRESSOR) 100 MG tablet    Sig: Take 2 hours prior to CT    Dispense:  1 tablet    Refill:  0    Patient Instructions  Medication Instructions:  Your physician recommends that you continue on your current medications as directed. Please refer to the Current Medication list given to you today.  *If you need a refill on your cardiac medications before your next appointment, please call your pharmacy*   Lab Work: CMET, Mag If you have labs (blood  work) drawn today and your tests are completely normal, you will receive your results only by: MyChart Message (if you have MyChart) OR A paper copy in the mail If you have any lab test that is abnormal or we need to change your treatment, we will call you to review the results.   Testing/Procedures:   Your cardiac CT will be scheduled at one of the below locations:   [facility] [address] [place], [place] [address] [phone]  If scheduled at [facility]), please arrive at the [facility] Entrance (Entrance C2) of [facility] 30 minutes prior to test start time. You can use the FREE valet parking offered at entrance C (encouraged to control the heart rate for the test)  Proceed to the [facility] Radiology Department (first floor) to check-in and test prep.  All radiology patients and guests should use entrance C2 at [facility], accessed from [facility], even though the hospital's physical address listed is [address]    Please follow these instructions carefully (unless otherwise directed):  An IV will be required for this test and Nitroglycerin will be given.   On the Night Before the Test: Be sure to Drink plenty of water. Do not consume any caffeinated/decaffeinated beverages or chocolate 12 hours prior to your test. Do not take any antihistamines 12 hours prior to your test.  On the Day of the Test: Drink plenty of water until 1 hour prior to the test. Do not eat any food 1 hour prior to test. You may take your regular medications prior to the test.  Take metoprolol (Lopressor) two hours prior to test. If you take Furosemide/Hydrochlorothiazide/Spironolactone/Chlorthalidone, please HOLD on the morning of the test. Patients who wear a continuous glucose monitor MUST remove the device prior to scanning. FEMALES- please wear underwire-free bra if available, avoid dresses & tight clothing         After the Test: Drink plenty of water. After receiving IV contrast, you may experience a mild flushed feeling. This is normal. On occasion, you may experience a mild rash up to 24 hours after the test. This is not dangerous. If this occurs, you can take Benadryl 25 mg, Zyrtec, Claritin, or Allegra and increase your fluid intake. (Patients taking Tikosyn should avoid Benadryl, and may take Zyrtec, Claritin, or Allegra) If you  experience trouble breathing, this can be serious. If it is severe call 911 IMMEDIATELY. If it is mild, please call our office.  We will call to schedule your test 2-4 weeks out understanding that some insurance companies will need an authorization prior to the service being performed.   For more information and frequently asked questions, please visit our website : [email or web address]  For non-scheduling related questions, please contact the cardiac imaging nurse navigator should you have any questions/concerns: Cardiac Imaging Nurse Navigators Direct Office Dial: [phone]   For scheduling needs, including cancellations and rescheduling, please call [place], [phone].    Follow-Up: At [facility], you and your health needs are our priority.  As part of our continuing mission to provide you with exceptional heart care, we have created designated Provider Care Teams.  These Care Teams include your primary Cardiologist (physician) and Advanced Practice Providers (APPs -  Physician Assistants and Nurse Practitioners) who all work together to provide you with the care you need, when you need it.   Your next appointment:   6 month(s)  Provider:   Thomasene Ripple, DO       Adopting a Healthy Lifestyle.  Know what a healthy weight is for you (roughly BMI <25) and aim to maintain this   Aim for 7+ servings of fruits and vegetables daily   65-80+ fluid ounces of water or unsweet tea for healthy kidneys   Limit to max 1 drink of alcohol  per day; avoid smoking/tobacco   Limit animal fats in diet for cholesterol and heart health - choose grass fed whenever available   Avoid highly processed foods, and foods high in saturated/trans fats   Aim for low stress - take time to unwind and care for your mental health   Aim for 150 min of moderate intensity exercise weekly for heart health, and weights twice weekly for bone health   Aim for 7-9 hours of sleep daily   When it comes to diets, agreement about the perfect plan isnt easy to find, even among the experts. Experts at the [facility] [employer] developed an idea known as the Healthy Eating Plate. Just imagine a plate divided into logical, healthy portions.   The emphasis is on diet quality:   Load up on vegetables and fruits - one-half of your plate: Aim for color and variety, and remember that potatoes dont count.   Go for whole grains - one-quarter of your plate: Whole wheat, barley, wheat berries, quinoa, oats, brown rice, and foods made with them. If you want pasta, go with whole wheat pasta.   Protein power - one-quarter of your plate: Fish, chicken, beans, and nuts are all healthy, versatile protein sources. Limit red meat.   The diet, however, does go beyond the plate, offering a few other suggestions.   Use healthy plant oils, such as olive, canola, soy, corn, sunflower and peanut. Check the labels, and avoid partially hydrogenated oil, which have unhealthy trans fats.   If youre thirsty, drink water. Coffee and tea are good in moderation, but skip sugary drinks and limit milk and dairy products to one or two daily servings.   The type of carbohydrate in the diet is more important than the amount. Some sources of carbohydrates, such as vegetables, fruits, whole grains, and beans-are healthier than others.   Finally, stay active  Signed, Thomasene Ripple, DO  11/30/2023 2:24 PM    [facility]

## 2023-12-01 ENCOUNTER — Other Ambulatory Visit: Payer: Self-pay | Admitting: Cardiology

## 2023-12-02 ENCOUNTER — Other Ambulatory Visit: Payer: Self-pay

## 2023-12-02 ENCOUNTER — Other Ambulatory Visit: Payer: Self-pay | Admitting: Cardiology

## 2023-12-04 ENCOUNTER — Other Ambulatory Visit: Payer: Self-pay | Admitting: Cardiology

## 2023-12-05 ENCOUNTER — Other Ambulatory Visit

## 2023-12-05 ENCOUNTER — Ambulatory Visit: Payer: Self-pay | Admitting: Family Medicine

## 2023-12-05 ENCOUNTER — Other Ambulatory Visit: Payer: Self-pay | Admitting: Cardiology

## 2023-12-05 DIAGNOSIS — D649 Anemia, unspecified: Secondary | ICD-10-CM

## 2023-12-06 ENCOUNTER — Other Ambulatory Visit: Payer: Self-pay | Admitting: Cardiology

## 2023-12-06 LAB — IRON,TIBC AND FERRITIN PANEL
Ferritin: 24 ng/mL (ref 15–150)
Iron Saturation: 5 % — CL (ref 15–55)
Iron: 17 ug/dL — ABNORMAL LOW (ref 27–159)
Total Iron Binding Capacity: 344 ug/dL (ref 250–450)
UIBC: 327 ug/dL (ref 131–425)

## 2023-12-12 ENCOUNTER — Other Ambulatory Visit: Payer: Self-pay

## 2023-12-12 DIAGNOSIS — D649 Anemia, unspecified: Secondary | ICD-10-CM

## 2023-12-18 ENCOUNTER — Encounter: Payer: Self-pay | Admitting: Cardiology

## 2024-01-02 ENCOUNTER — Ambulatory Visit
Admission: RE | Admit: 2024-01-02 | Discharge: 2024-01-02 | Disposition: A | Discharge status: Home / Self Care | Source: Ambulatory Visit | Attending: Internal Medicine | Admitting: Internal Medicine

## 2024-01-02 ENCOUNTER — Ambulatory Visit: Payer: 59

## 2024-01-02 DIAGNOSIS — Z1231 Encounter for screening mammogram for malignant neoplasm of breast: Secondary | ICD-10-CM

## 2024-01-07 ENCOUNTER — Encounter (HOSPITAL_COMMUNITY): Payer: Self-pay

## 2024-01-09 ENCOUNTER — Encounter: Payer: Self-pay | Admitting: Internal Medicine

## 2024-01-09 ENCOUNTER — Ambulatory Visit (HOSPITAL_COMMUNITY)
Admission: RE | Admit: 2024-01-09 | Discharge: 2024-01-09 | Disposition: A | Discharge status: Home / Self Care | Source: Ambulatory Visit | Attending: Cardiology | Admitting: Cardiology

## 2024-01-09 DIAGNOSIS — R072 Precordial pain: Secondary | ICD-10-CM | POA: Insufficient documentation

## 2024-01-09 MED ORDER — NITROGLYCERIN 0.4 MG SL SUBL
SUBLINGUAL_TABLET | SUBLINGUAL | Status: AC
  Filled 2024-01-09: qty 2

## 2024-01-09 MED ORDER — NITROGLYCERIN 0.4 MG SL SUBL
0.8000 mg | SUBLINGUAL_TABLET | Freq: Once | SUBLINGUAL | Status: AC
  Administered 2024-01-09: 0.8 mg via SUBLINGUAL

## 2024-01-09 MED ORDER — IOHEXOL 350 MG/ML SOLN
95.0000 mL | Freq: Once | INTRAVENOUS | Status: AC | PRN
  Administered 2024-01-09: 95 mL via INTRAVENOUS

## 2024-01-10 ENCOUNTER — Ambulatory Visit

## 2024-01-14 ENCOUNTER — Encounter: Payer: Self-pay | Admitting: Cardiology

## 2024-02-03 ENCOUNTER — Ambulatory Visit: Payer: Self-pay

## 2024-02-03 DIAGNOSIS — D649 Anemia, unspecified: Secondary | ICD-10-CM

## 2024-02-04 ENCOUNTER — Encounter: Payer: Self-pay | Admitting: Internal Medicine

## 2024-02-04 LAB — IRON,TIBC AND FERRITIN PANEL
Ferritin: 24 ng/mL (ref 15–150)
Iron Saturation: 10 % — ABNORMAL LOW (ref 15–55)
Iron: 32 ug/dL (ref 27–159)
Total Iron Binding Capacity: 330 ug/dL (ref 250–450)
UIBC: 298 ug/dL (ref 131–425)

## 2024-02-10 ENCOUNTER — Encounter: Payer: Self-pay | Admitting: Internal Medicine

## 2024-02-10 ENCOUNTER — Other Ambulatory Visit

## 2024-02-10 DIAGNOSIS — D649 Anemia, unspecified: Secondary | ICD-10-CM

## 2024-02-10 LAB — CBC WITH DIFFERENTIAL/PLATELET
Basophils Absolute: 0 10*3/uL (ref 0.0–0.2)
Basos: 0 %
EOS (ABSOLUTE): 0 10*3/uL (ref 0.0–0.4)
Eos: 0 %
Hematocrit: 36.6 % (ref 34.0–46.6)
Hemoglobin: 11.9 g/dL (ref 11.1–15.9)
Immature Grans (Abs): 0 10*3/uL (ref 0.0–0.1)
Immature Granulocytes: 0 %
Lymphocytes Absolute: 1.6 10*3/uL (ref 0.7–3.1)
Lymphs: 26 %
MCH: 27.2 pg (ref 26.6–33.0)
MCHC: 32.5 g/dL (ref 31.5–35.7)
MCV: 84 fL (ref 79–97)
Monocytes Absolute: 0.5 10*3/uL (ref 0.1–0.9)
Monocytes: 7 %
Neutrophils Absolute: 4.2 10*3/uL (ref 1.4–7.0)
Neutrophils: 67 %
Platelets: 371 10*3/uL (ref 150–450)
RBC: 4.37 x10E6/uL (ref 3.77–5.28)
RDW: 17.8 % — ABNORMAL HIGH (ref 11.7–15.4)
WBC: 6.4 10*3/uL (ref 3.4–10.8)

## 2024-02-11 ENCOUNTER — Encounter: Payer: Self-pay | Admitting: Internal Medicine

## 2024-02-28 ENCOUNTER — Ambulatory Visit (HOSPITAL_COMMUNITY): Payer: Self-pay | Admitting: Emergency Medicine

## 2024-02-28 ENCOUNTER — Ambulatory Visit (INDEPENDENT_AMBULATORY_CARE_PROVIDER_SITE_OTHER)

## 2024-02-28 ENCOUNTER — Encounter (HOSPITAL_COMMUNITY): Payer: Self-pay

## 2024-02-28 ENCOUNTER — Ambulatory Visit (HOSPITAL_COMMUNITY)
Admission: EM | Admit: 2024-02-28 | Discharge: 2024-02-28 | Disposition: A | Discharge status: Home / Self Care | Attending: Emergency Medicine | Admitting: Emergency Medicine

## 2024-02-28 DIAGNOSIS — R079 Chest pain, unspecified: Secondary | ICD-10-CM

## 2024-02-28 DIAGNOSIS — J45901 Unspecified asthma with (acute) exacerbation: Secondary | ICD-10-CM

## 2024-02-28 MED ORDER — IPRATROPIUM-ALBUTEROL 0.5-2.5 (3) MG/3ML IN SOLN
3.0000 mL | Freq: Once | RESPIRATORY_TRACT | Status: AC
  Administered 2024-02-28: 3 mL via RESPIRATORY_TRACT

## 2024-02-28 MED ORDER — PREDNISONE 20 MG PO TABS: 40.0000 mg | ORAL_TABLET | Freq: Every day | ORAL | 0 refills | Status: AC

## 2024-02-28 MED ORDER — IPRATROPIUM-ALBUTEROL 0.5-2.5 (3) MG/3ML IN SOLN
RESPIRATORY_TRACT | Status: AC
  Filled 2024-02-28: qty 3

## 2024-02-28 NOTE — ED Provider Notes (Signed)
 [facility]    CSN: [id number] Arrival date & time: 02/28/24  1651      History   Chief Complaint No chief complaint on file.   HPI Tammie Williams is a 53 y.o. female.   Patient presents to clinic over concern of chest tightness, coughing, wheezing and shortness of breath since yesterday.  She does work from home and in her building people smoke, feels like this aggravates her asthma.  She has a history of asthma and has been using her inhaler more than usual.  She is having bilateral chest pain, this is reproducible with palpation.  The history is provided by the patient and medical records.    Past Medical History:  Diagnosis Date   Anemia    Anemia    Anxiety    Asthma    GERD (gastroesophageal reflux disease) 01/01/2018   Near syncope 01/01/2018   Palpitations 01/01/2018   Sickle cell trait (HCC)    Vitamin D  deficiency     Patient Active Problem List   Diagnosis Date Noted   Encounter for general adult medical examination w/o abnormal findings 11/19/2023   Anxiety 11/19/2023   Atypical chest pain 08/21/2023   Mild intermittent asthma with exacerbation 08/21/2023   Class 2 obesity due to excess calories with body mass index (BMI) of 35.0 to 35.9 in adult 08/21/2023   Neck pain 11/10/2022   Moderate episode of recurrent major depressive disorder (HCC) 11/10/2022   Class 2 obesity due to excess calories without serious comorbidity with body mass index (BMI) of 37.0 to 37.9 in adult 11/10/2022   Abnormal glucose 04/02/2022   Increased frequency of urination 08/31/2021   Menorrhagia 08/31/2021   Vaginitis 08/31/2021   Constipation 08/31/2021   Diastasis recti 08/31/2021   Umbilical hernia without obstruction and without gangrene 08/31/2021   Current moderate episode of major depressive disorder, unspecified whether recurrent (HCC) 10/24/2020   Palpitations 01/01/2018   Near syncope 01/01/2018   Body mass index (BMI) of 37.0 to 37.9 in adult 05/01/2017    Iron deficiency anemia 08/17/2014   Dizziness 01/31/2014   URI (upper respiratory infection) 10/09/2013   Adjustment disorder with disturbance of emotion 09/17/2013   Patellar tendinitis 06/18/2011   Swelling of knee joint, left 06/18/2011   Other malaise and fatigue 01/25/2011   Premenstrual tension syndrome 01/25/2011   Sickle cell trait (HCC) 10/20/2010   Vitamin D  deficiency 08/18/2010   Allergic rhinitis 05/23/2010   Reactive airway disease 05/23/2010    Past Surgical History:  Procedure Laterality Date   CESAREAN SECTION     WISDOM TOOTH EXTRACTION      OB History     Gravida  4   Para  1   Term  1   Preterm      AB  3   Living  1      SAB  1   IAB  1   Ectopic  1   Multiple      Live Births               Home Medications    Prior to Admission medications   Medication Sig Start Date End Date Taking? Authorizing Provider  predniSONE  (DELTASONE ) 20 MG tablet Take 2 tablets (40 mg total) by mouth daily for 5 days. 02/28/24 03/04/24 Yes Keidrick Murty  N, FNP  Albuterol -Budesonide (AIRSUPRA ) 90-80 MCG/ACT AERO Inhale 2 puffs into the lungs every 4 (four) hours as needed. Patient not taking: Reported on 11/28/2023 08/20/23  Melodie Spry, NP  Ascorbic Acid (VITAMIN C PO) Take by mouth.    [provider]  ASHWAGANDHA PO Take 1 capsule by mouth as needed.    [provider]  citalopram  (CELEXA ) 10 MG tablet Take 1 tablet (10 mg total) by mouth daily. 09/10/23 09/09/24  Cleave Curling, MD  MAGNESIUM PO Take 1 tablet by mouth daily at 6 (six) AM.    [provider]  metoprolol  tartrate (LOPRESSOR ) 100 MG tablet TAKE 1 TABLET BY MOUTH 2 HOURS PRIOR TO CT 11/29/23   Tobb, Kardie, DO  Misc Natural Products (ELDERBERRY IMMUNE COMPLEX PO)  10/02/23   [provider]  Multiple Vitamin (MULTIVITAMIN PO) Take by mouth.    [provider]  UNABLE TO FIND Med Name: Hair, skin, nails supplement Patient not taking:  Reported on 11/28/2023    [provider]  VITAMIN D  PO Take by mouth.    [provider]    Family History Family History  Problem Relation Age of Onset   Healthy Mother    Healthy Father    CAD Sister    Stroke Maternal Grandmother    Bone cancer Maternal Grandmother    Cancer Maternal Uncle     Social History Social History   Tobacco Use   Smoking status: Never    Passive exposure: Never   Smokeless tobacco: Never  Vaping Use   Vaping status: Never Used  Substance Use Topics   Alcohol use: Not Currently    Comment: very rare   Drug use: Never     Allergies   Patient has no known allergies.   Review of Systems Review of Systems  Per HPI  Physical Exam Triage Vital Signs ED Triage Vitals  Encounter Vitals Group     BP 02/28/24 1734 (!) 148/87     Systolic BP Percentile --      Diastolic BP Percentile --      Pulse Rate 02/28/24 1706 68     Resp 02/28/24 1706 18     Temp 02/28/24 1734 98.5 F (36.9 C)     Temp Source 02/28/24 1734 Oral     SpO2 02/28/24 1706 100 %     Weight --      Height --      Head Circumference --      Peak Flow --      Pain Score 02/28/24 1732 6     Pain Loc --      Pain Education --      Exclude from Growth Chart --    No data found.  Updated Vital Signs BP (!) 148/87 (BP Location: Left Arm)   Pulse 68   Temp 98.5 F (36.9 C) (Oral)   Resp 20   SpO2 99%   Visual Acuity Right Eye Distance:   Left Eye Distance:   Bilateral Distance:    Right Eye Near:   Left Eye Near:    Bilateral Near:     Physical Exam Vitals and nursing note reviewed.  Constitutional:      Appearance: Normal appearance.  HENT:     Head: Normocephalic and atraumatic.     Right Ear: External ear normal.     Left Ear: External ear normal.     Nose: Nose normal.     Mouth/Throat:     Mouth: Mucous membranes are moist.  Eyes:     Conjunctiva/sclera: Conjunctivae normal.  Cardiovascular:     Rate and Rhythm: Normal rate.  Pulmonary:     Effort: Pulmonary effort is normal. No respiratory distress.  Skin:    General: Skin is warm and dry.  Neurological:     General: No focal deficit present.     Mental Status: She is alert.  Psychiatric:        Mood and Affect: Mood normal.        Behavior: Behavior is cooperative.      UC Treatments / Results  Labs (all labs ordered are listed, but only abnormal results are displayed) Labs Reviewed - No data to display  EKG   Radiology No results found.  Procedures Procedures (including critical care time)  Medications Ordered in UC Medications  ipratropium-albuterol  (DUONEB) 0.5-2.5 (3) MG/3ML nebulizer solution 3 mL (3 mLs Nebulization Given 02/28/24 1838)    Initial Impression / Assessment and Plan / UC Course  I have reviewed the triage vital signs and the nursing notes.  Pertinent labs & imaging results that were available during my care of the patient were reviewed by me and considered in my medical decision making (see chart for details).  Vitals in triage reviewed, patient is hemodynamically stable.  Lungs vesicular, heart with regular rate and rhythm.  Chest pain is anterior and reproducible to palpation.   EKG shows normal sinus rhythm, no ST elevation or ST depression.  Chest x-ray by my interpretation does not show acute infiltrate, low concern for pneumonia.  Patient feeling better after DuoNeb.  Continues to have vesicular lung sounds.  Will treat for asthma exacerbation with prednisone  burst.  Encouraged to avoid environmental triggers.  Plan of care, follow-up care return precautions given, no questions at this time, work note provided.     Final Clinical Impressions(s) / UC Diagnoses   Final diagnoses:  Chest pain, unspecified type  Asthma with acute exacerbation, unspecified asthma severity, unspecified whether persistent     Discharge Instructions      EKG and chest x-ray were reassuring.  Continue to use your albuterol   inhaler every 6 hours as needed for wheezing and shortness of breath.  Start your steroids tomorrow with breakfast and take them until finished.  Avoid environmental smoke exposure, as this seems to be a trigger for your asthma.  Follow-up with your primary care provider or return to clinic if no improvement, return to clinic for new or urgent symptoms.   ED Prescriptions     Medication Sig Dispense Auth. Provider   predniSONE  (DELTASONE ) 20 MG tablet Take 2 tablets (40 mg total) by mouth daily for 5 days. 10 tablet Harlow Lighter, Violanda Bobeck  N, FNP      PDMP not reviewed this encounter.   Arcadio Beams, FNP 02/28/24 [phone]

## 2024-02-28 NOTE — Discharge Instructions (Addendum)
 EKG and chest x-ray were reassuring.  Continue to use your albuterol  inhaler every 6 hours as needed for wheezing and shortness of breath.  Start your steroids tomorrow with breakfast and take them until finished.  Avoid environmental smoke exposure, as this seems to be a trigger for your asthma.  Follow-up with your primary care provider or return to clinic if no improvement, return to clinic for new or urgent symptoms.

## 2024-02-28 NOTE — ED Triage Notes (Signed)
 Pt c/o chest tightness, coughing, and using her inhaler more often x1 days. NAD, speaking in complete sentence.

## 2024-03-11 ENCOUNTER — Encounter: Payer: Self-pay | Admitting: Internal Medicine

## 2024-03-24 ENCOUNTER — Other Ambulatory Visit: Payer: Self-pay | Admitting: Internal Medicine

## 2024-03-24 DIAGNOSIS — D649 Anemia, unspecified: Secondary | ICD-10-CM

## 2024-03-24 MED ORDER — FUSION PLUS PO CAPS: ORAL_CAPSULE | ORAL | 1 refills | Status: AC

## 2024-04-14 ENCOUNTER — Other Ambulatory Visit: Payer: Self-pay | Admitting: Internal Medicine

## 2024-05-05 ENCOUNTER — Encounter: Payer: Self-pay | Admitting: Internal Medicine

## 2024-05-13 ENCOUNTER — Ambulatory Visit (INDEPENDENT_AMBULATORY_CARE_PROVIDER_SITE_OTHER): Payer: Self-pay | Admitting: Internal Medicine

## 2024-05-13 ENCOUNTER — Encounter: Payer: Self-pay | Admitting: Internal Medicine

## 2024-05-13 VITALS — BP 110/70 | HR 66 | Temp 97.9°F | Ht 64.0 in | Wt 206.4 lb

## 2024-05-13 DIAGNOSIS — E66812 Obesity, class 2: Secondary | ICD-10-CM | POA: Diagnosis not present

## 2024-05-13 DIAGNOSIS — Z6835 Body mass index (BMI) 35.0-35.9, adult: Secondary | ICD-10-CM

## 2024-05-13 DIAGNOSIS — D500 Iron deficiency anemia secondary to blood loss (chronic): Secondary | ICD-10-CM

## 2024-05-13 DIAGNOSIS — J4521 Mild intermittent asthma with (acute) exacerbation: Secondary | ICD-10-CM

## 2024-05-13 DIAGNOSIS — F419 Anxiety disorder, unspecified: Secondary | ICD-10-CM | POA: Diagnosis not present

## 2024-05-13 DIAGNOSIS — D259 Leiomyoma of uterus, unspecified: Secondary | ICD-10-CM

## 2024-05-13 DIAGNOSIS — E6609 Other obesity due to excess calories: Secondary | ICD-10-CM

## 2024-05-13 LAB — CBC
Hematocrit: 41.3 % (ref 34.0–46.6)
Hemoglobin: 13.5 g/dL (ref 11.1–15.9)
MCH: 28.4 pg (ref 26.6–33.0)
MCHC: 32.7 g/dL (ref 31.5–35.7)
MCV: 87 fL (ref 79–97)
Platelets: 332 x10E3/uL (ref 150–450)
RBC: 4.76 x10E6/uL (ref 3.77–5.28)
RDW: 14.8 % (ref 11.7–15.4)
WBC: 7.4 x10E3/uL (ref 3.4–10.8)

## 2024-05-13 NOTE — Progress Notes (Signed)
 I,Tammie Williams, CMA,acting as a Neurosurgeon for Tammie LOISE Slocumb, MD.,have documented all relevant documentation on the behalf of Tammie LOISE Slocumb, MD,as directed by  Tammie LOISE Slocumb, MD while in the presence of Tammie LOISE Slocumb, MD.  Subjective:  Patient ID: Tammie Williams , female    DOB: June 16, 1971 , 53 y.o.   MRN: [id number]  Chief Complaint  Patient presents with   Anemia    Patient presents today for anemia follow up. She reports compliance with supplements. Denies headache, chest pain & sob.  She does have an ultrasound sch for tomorrow. Order put in by her GYN for pelvic pain.     HPI Discussed the use of AI scribe software for clinical note transcription with the patient, who gave verbal consent to proceed.  History of Present Illness Tammie Williams is a 53 year old female who presents for follow up of iron deficiency with increased symptoms related to uterine fibroids.  She has been experiencing increased symptoms related to her longstanding uterine fibroids. An ultrasound is scheduled for tomorrow to assess the current state of the fibroids, as she has been more bothersome than usual.  She has been taking citalopram  at night, which helps her relax and sleep. Since starting the medication, she feels less sluggish. She takes it around 10 or 11 PM and goes to bed shortly after.  For the past three months, she has been using liquid iron, which she started after having COVID. She prefers the liquid form over pills or fusion plus.  She has had a sniffly nose for about a month, which she attributes to sleeping with a fan and possibly needing to clean her carpet. She has resumed using her nasal spray due to ragweed season.  She walks her son's dog once or twice a week, though her exercise routine fluctuates. Her mood has improved by about fifty percent, and she feels more interactive.    Past Medical History:  Diagnosis Date   Anemia    Anemia    Anxiety    Asthma    GERD  (gastroesophageal reflux disease) 01/01/2018   Near syncope 01/01/2018   Palpitations 01/01/2018   Sickle cell trait (HCC)    Vitamin D  deficiency      Family History  Problem Relation Age of Onset   Healthy Mother    Healthy Father    CAD Sister    Stroke Maternal Grandmother    Bone cancer Maternal Grandmother    Cancer Maternal Uncle      Current Outpatient Medications:    Ascorbic Acid (VITAMIN C PO), Take by mouth., Disp: , Rfl:    ASHWAGANDHA PO, Take 1 capsule by mouth as needed., Disp: , Rfl:    citalopram  (CELEXA ) 10 MG tablet, TAKE 1 TABLET(10 MG) BY MOUTH DAILY, Disp: 30 tablet, Rfl: 1   Iron-FA-B Cmp-C-Biot-Probiotic (FUSION PLUS) CAPS, TAKE ONE TABLET DAILY., Disp: 90 capsule, Rfl: 1   MAGNESIUM PO, Take 1 tablet by mouth daily at 6 (six) AM., Disp: , Rfl:    Misc Natural Products (ELDERBERRY IMMUNE COMPLEX PO), , Disp: , Rfl:    Multiple Vitamin (MULTIVITAMIN PO), Take by mouth., Disp: , Rfl:    VITAMIN D  PO, Take by mouth., Disp: , Rfl:    Albuterol -Budesonide (AIRSUPRA ) 90-80 MCG/ACT AERO, Inhale 2 puffs into the lungs every 4 (four) hours as needed. (Patient not taking: Reported on 11/28/2023), Disp: 10.7 g, Rfl: 3   UNABLE TO FIND, Med Name: Hair, skin, nails  supplement (Patient not taking: Reported on 05/13/2024), Disp: , Rfl:    No Known Allergies   Review of Systems  Constitutional: Negative.   Respiratory: Negative.    Cardiovascular: Negative.   Gastrointestinal: Negative.   Neurological: Negative.   Psychiatric/Behavioral: Negative.       Today's Vitals   05/13/24 1525  BP: 110/70  Pulse: 66  Temp: 97.9 F (36.6 C)  SpO2: 98%  Weight: 206 lb 6.4 oz (93.6 kg)  Height: 5' 4 (1.626 m)   Body mass index is 35.43 kg/m.  Wt Readings from Last 3 Encounters:  05/13/24 206 lb 6.4 oz (93.6 kg)  11/28/23 200 lb (90.7 kg)  11/19/23 200 lb (90.7 kg)     Objective:  Physical Exam Vitals and nursing note reviewed.  Constitutional:      Appearance:  Normal appearance. She is obese.  HENT:     Head: Normocephalic and atraumatic.  Eyes:     Extraocular Movements: Extraocular movements intact.  Cardiovascular:     Rate and Rhythm: Normal rate and regular rhythm.     Heart sounds: Normal heart sounds.  Pulmonary:     Effort: Pulmonary effort is normal.     Breath sounds: Normal breath sounds.  Musculoskeletal:     Cervical back: Normal range of motion.  Skin:    General: Skin is warm.  Neurological:     General: No focal deficit present.     Mental Status: She is alert.  Psychiatric:        Mood and Affect: Mood normal.        Behavior: Behavior normal.         Assessment And Plan:  Iron deficiency anemia due to chronic blood loss Assessment & Plan: Managed with liquid iron supplementation. Blood count improved since May, indicating positive response. - Recheck liver and kidney function. - Recheck iron levels. - Continue liquid iron supplementation.  Orders: -     Ambulatory referral to Obstetrics / Gynecology -     CBC -     Iron, TIBC and Ferritin Panel  Uterine leiomyoma, unspecified location Assessment & Plan: Fibroids have been more bothersome recently. - Ensure ultrasound results are sent to provider. - Refer to GYN at [facility] on [address]  Orders: -     Ambulatory referral to Obstetrics / Gynecology  Anxiety Assessment & Plan: Managed with citalopram , taken at night. Medication improves sleep and mood by 50%. - Adjust citalopram  intake to 10 PM. - Encourage bedtime relaxation by 10:30 PM.   Class 2 obesity due to excess calories without serious comorbidity with body mass index (BMI) of 35.0 to 35.9 in adult Assessment & Plan: She is encouraged to strive for BMI less than 30 to decrease cardiac risk. Advised to aim for at least 150 minutes of exercise per week.     Return if symptoms worsen or fail to improve.  Patient was given opportunity to ask questions. Patient verbalized  understanding of the plan and was able to repeat key elements of the plan. All questions were answered to their satisfaction.   I, Tammie LOISE Slocumb, MD, have reviewed all documentation for this visit. The documentation on 05/13/24 for the exam, diagnosis, procedures, and orders are all accurate and complete.   IF YOU HAVE BEEN REFERRED TO A SPECIALIST, IT MAY TAKE 1-2 WEEKS TO SCHEDULE/PROCESS THE REFERRAL. IF YOU HAVE NOT HEARD FROM US /SPECIALIST IN TWO WEEKS, PLEASE GIVE US  A CALL AT [phone].  THE PATIENT IS ENCOURAGED TO PRACTICE SOCIAL DISTANCING DUE TO THE COVID-19 PANDEMIC.

## 2024-05-14 LAB — IRON,TIBC AND FERRITIN PANEL
Ferritin: 41 ng/mL (ref 15–150)
Iron Saturation: 33 % (ref 15–55)
Iron: 113 ug/dL (ref 27–159)
Total Iron Binding Capacity: 341 ug/dL (ref 250–450)
UIBC: 228 ug/dL (ref 131–425)

## 2024-05-15 ENCOUNTER — Ambulatory Visit: Payer: Self-pay | Admitting: Internal Medicine

## 2024-05-15 DIAGNOSIS — D259 Leiomyoma of uterus, unspecified: Secondary | ICD-10-CM | POA: Insufficient documentation

## 2024-05-15 NOTE — Assessment & Plan Note (Signed)
 Managed with liquid iron supplementation. Blood count improved since May, indicating positive response. - Recheck liver and kidney function. - Recheck iron levels. - Continue liquid iron supplementation.

## 2024-05-15 NOTE — Assessment & Plan Note (Signed)
 Managed with citalopram , taken at night. Medication improves sleep and mood by 50%. - Adjust citalopram  intake to 10 PM. - Encourage bedtime relaxation by 10:30 PM.

## 2024-05-15 NOTE — Assessment & Plan Note (Signed)
 She is encouraged to strive for BMI less than 30 to decrease cardiac risk. Advised to aim for at least 150 minutes of exercise per week.

## 2024-05-15 NOTE — Assessment & Plan Note (Signed)
 Fibroids have been more bothersome recently. - Ensure ultrasound results are sent to provider. - Refer to GYN at [facility] on [address]

## 2024-05-15 NOTE — Patient Instructions (Signed)

## 2024-07-13 ENCOUNTER — Encounter: Payer: Self-pay | Admitting: Internal Medicine

## 2024-09-17 ENCOUNTER — Encounter: Payer: Self-pay | Admitting: Internal Medicine

## 2024-11-24 ENCOUNTER — Encounter: Payer: 59 | Admitting: Internal Medicine
